# Patient Record
Sex: Female | Born: 1935 | Race: White | Hispanic: No | Smoking: Former smoker
Health system: Southern US, Community
[De-identification: ages and names within clinical notes are randomized; demographics above are authoritative.]

## PROBLEM LIST (undated history)

## (undated) DIAGNOSIS — F32A Depression, unspecified: Secondary | ICD-10-CM

## (undated) DIAGNOSIS — F0390 Unspecified dementia without behavioral disturbance: Secondary | ICD-10-CM

## (undated) DIAGNOSIS — F419 Anxiety disorder, unspecified: Secondary | ICD-10-CM

## (undated) DIAGNOSIS — F329 Major depressive disorder, single episode, unspecified: Secondary | ICD-10-CM

## (undated) DIAGNOSIS — R55 Syncope and collapse: Secondary | ICD-10-CM

## (undated) DIAGNOSIS — I10 Essential (primary) hypertension: Secondary | ICD-10-CM

## (undated) HISTORY — DX: Depression, unspecified: F32.A

## (undated) HISTORY — DX: Anxiety disorder, unspecified: F41.9

## (undated) HISTORY — DX: Syncope and collapse: R55

## (undated) HISTORY — PX: APPENDECTOMY: SHX54

## (undated) HISTORY — DX: Major depressive disorder, single episode, unspecified: F32.9

---

## 2004-05-24 ENCOUNTER — Ambulatory Visit: Payer: Self-pay | Admitting: Unknown Physician Specialty

## 2005-02-24 ENCOUNTER — Ambulatory Visit: Payer: Self-pay | Admitting: Internal Medicine

## 2005-03-01 ENCOUNTER — Ambulatory Visit: Payer: Self-pay | Admitting: Internal Medicine

## 2005-09-01 ENCOUNTER — Ambulatory Visit: Payer: Self-pay | Admitting: Internal Medicine

## 2006-03-08 ENCOUNTER — Ambulatory Visit: Payer: Self-pay | Admitting: Internal Medicine

## 2007-08-14 ENCOUNTER — Ambulatory Visit: Payer: Self-pay | Admitting: Internal Medicine

## 2007-08-21 ENCOUNTER — Ambulatory Visit: Payer: Self-pay | Admitting: Ophthalmology

## 2007-08-21 ENCOUNTER — Other Ambulatory Visit: Payer: Self-pay

## 2007-08-27 ENCOUNTER — Ambulatory Visit: Payer: Self-pay | Admitting: Ophthalmology

## 2008-07-07 ENCOUNTER — Ambulatory Visit: Payer: Self-pay | Admitting: Unknown Physician Specialty

## 2008-09-25 ENCOUNTER — Ambulatory Visit: Payer: Self-pay | Admitting: Internal Medicine

## 2009-10-01 ENCOUNTER — Ambulatory Visit: Payer: Self-pay | Admitting: Internal Medicine

## 2010-10-05 ENCOUNTER — Ambulatory Visit: Payer: Self-pay | Admitting: Internal Medicine

## 2011-03-09 ENCOUNTER — Inpatient Hospital Stay: Payer: Self-pay | Admitting: Internal Medicine

## 2011-05-06 ENCOUNTER — Inpatient Hospital Stay: Payer: Self-pay | Admitting: Internal Medicine

## 2011-05-06 DIAGNOSIS — R55 Syncope and collapse: Secondary | ICD-10-CM

## 2011-05-09 DIAGNOSIS — R55 Syncope and collapse: Secondary | ICD-10-CM

## 2011-05-12 ENCOUNTER — Telehealth: Payer: Self-pay | Admitting: *Deleted

## 2011-05-12 NOTE — Telephone Encounter (Signed)
Spoke to daughter, notified of pt's holter results. She has received event monitor and will place today, pt will f/u 10/9.

## 2011-05-12 NOTE — Telephone Encounter (Signed)
Notified pt of holter results per Dr. Mariah Milling NSR with frequent PVCs, rare APCs. No cause of dizziness noted. Pt has not received her 30 day event monitor yet (they do not send until pt answers phone to confirm delivery). I called [employer], they sent monitor 10/2, pt should receive today and I assured that they will call her with instructions on placement as she has many questions. Pt will f/u with TG next week.

## 2011-05-16 ENCOUNTER — Encounter: Payer: Self-pay | Admitting: Cardiovascular Disease

## 2011-05-17 ENCOUNTER — Encounter: Payer: Self-pay | Admitting: Cardiovascular Disease

## 2011-06-14 ENCOUNTER — Encounter: Payer: Self-pay | Admitting: Cardiovascular Disease

## 2011-06-14 ENCOUNTER — Ambulatory Visit (INDEPENDENT_AMBULATORY_CARE_PROVIDER_SITE_OTHER): Payer: Medicare Other | Admitting: Cardiovascular Disease

## 2011-06-14 VITALS — BP 124/78 | HR 72 | Ht 63.0 in | Wt 128.0 lb

## 2011-06-14 DIAGNOSIS — R55 Syncope and collapse: Secondary | ICD-10-CM

## 2011-06-14 NOTE — Assessment & Plan Note (Signed)
Etiology of numerous episodes of syncope is uncertain at this time. Workup has been negative including stress test, echo, MRI, CT, Holter and 30 day event monitor. She attributes her symptoms to stress from her husband who has dementia.   If her symptoms were in fact syncope which I believe they are, it is likely not secondary to stress and probably will present itself again. I suspect there is underlying dementia and the patient and a sense of denial. One option if she has additional episodes would be an implantable loop monitor which would monitor her heart rhythm was for 3 years. She is not on any medication currently that could contribute to orthostasis. Her last contact us if she has any further lightheadedness or syncope. I'm concerned about her living alone and driving though we do not have any concrete data I would restrict her as her testing has been normal. She has not had a life alert button Which she needs as she does live alone. No family presented with her on today's visit.

## 2011-06-14 NOTE — Patient Instructions (Signed)
You are doing well. No medication changes were made.  Please call us if you have new issues that need to be addressed before your next appt.  The office will contact you for a follow up Appt. In 12 months  
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
## 2011-09-02 ENCOUNTER — Inpatient Hospital Stay: Payer: Self-pay | Admitting: Unknown Physician Specialty

## 2011-09-02 LAB — COMPREHENSIVE METABOLIC PANEL
Alkaline Phosphatase: 46 U/L — ABNORMAL LOW (ref 50–136)
Calcium, Total: 8.8 mg/dL (ref 8.5–10.1)
Co2: 28 mmol/L (ref 21–32)
Osmolality: 277 (ref 275–301)
SGOT(AST): 49 U/L — ABNORMAL HIGH (ref 15–37)
Sodium: 138 mmol/L (ref 136–145)

## 2011-09-02 LAB — CBC
MCH: 32.9 pg (ref 26.0–34.0)
Platelet: 142 10*3/uL — ABNORMAL LOW (ref 150–440)
WBC: 12.9 10*3/uL — ABNORMAL HIGH (ref 3.6–11.0)

## 2011-09-02 LAB — TROPONIN I: Troponin-I: <0.02 ng/mL

## 2011-09-02 LAB — URINALYSIS, COMPLETE
Leukocyte Esterase: NEGATIVE
Protein: NEGATIVE
RBC,UR: NONE SEEN /HPF (ref 0–5)
WBC UR: NONE SEEN /HPF (ref 0–5)

## 2011-09-04 LAB — CBC WITH DIFFERENTIAL/PLATELET
Basophil #: 0 10*3/uL (ref 0.0–0.1)
Basophil %: 0.3 %
Eosinophil #: 0.1 10*3/uL (ref 0.0–0.7)
Eosinophil %: 0.7 %
Lymphocyte #: 1 10*3/uL (ref 1.0–3.6)
MCH: 32.6 pg (ref 26.0–34.0)
MCV: 95 fL (ref 80–100)
Monocyte #: 0.6 10*3/uL (ref 0.0–0.7)
Platelet: 107 10*3/uL — ABNORMAL LOW (ref 150–440)
RBC: 3.21 10*6/uL — ABNORMAL LOW (ref 3.80–5.20)

## 2012-05-08 ENCOUNTER — Ambulatory Visit: Payer: Self-pay | Admitting: Ophthalmology

## 2012-05-28 ENCOUNTER — Ambulatory Visit: Payer: Self-pay | Admitting: Ophthalmology

## 2014-11-25 NOTE — Op Note (Signed)
PATIENT NAME:  Vanessa Jackson, Vanessa Jackson MR#:  [id number] DATE OF BIRTH:  [birth year]  DATE OF PROCEDURE:  05/28/2012  PREOPERATIVE DIAGNOSIS:  Cataract, right eye.   POSTOPERATIVE DIAGNOSIS:  Cataract, right eye.  PROCEDURE PERFORMED:  Extracapsular cataract extraction using phacoemulsification with placement of an Alcon SN6CWS, 22.0-diopter posterior chamber lens, serial # [id number].  SURGEON:  Maylon PeppersSteven A. Wynston Romey, MD  ASSISTANT:  None.  ANESTHESIA:  4% lidocaine and 0.75% Marcaine in a 50/50 mixture with 10 units/mL of Hylenex added, given as a peribulbar.  ANESTHESIOLOGIST:  Dr. Pernell DupreAdams  COMPLICATIONS:  None.  ESTIMATED BLOOD LOSS:  Less than 1 mL.  DESCRIPTION OF PROCEDURE:  The patient was brought to the operating room and given a peribulbar block.  The patient was then prepped and draped in the usual fashion.  The vertical rectus muscles were imbricated using 5-0 silk sutures.  These sutures were then clamped to the sterile drapes as bridle sutures.  A limbal peritomy was performed extending two clock hours and hemostasis was obtained with cautery.  A partial thickness scleral groove was made at the surgical limbus and dissected anteriorly in a lamellar dissection using an Alcon crescent knife.  The anterior chamber was entered superonasally with a Superblade and through the lamellar dissection with a 2.6 mm keratome.  DisCoVisc was used to replace the aqueous and a continuous tear capsulorrhexis was carried out.  Hydrodissection and hydrodelineation were carried out with balanced salt and a 27 gauge canula.  The nucleus was rotated to confirm the effectiveness of the hydrodissection.  Phacoemulsification was carried out using a divide-and-conquer technique.  Total ultrasound time was 4 minutes and 31.8 seconds with an average power of 27.5 percent. CDE 106.8.  Irrigation/aspiration was used to remove the residual cortex.  DisCoVisc was used to inflate the capsule and the internal incision  was enlarged to 3 mm with the crescent knife.  The intraocular lens was folded and inserted into the capsular bag using the AcrySert delivery system.  Irrigation/aspiration was used to remove the residual DisCoVisc.  Miostat was injected into the anterior chamber through the paracentesis track to inflate the anterior chamber and induce miosis.  The wound was checked for leaks and none were found. The conjunctiva was closed with cautery and the bridle sutures were removed.  Two drops of 0.3% Vigamox were placed on the eye.   An eye shield was placed on the eye.  The patient was discharged to the recovery room in good condition.  ____________________________ Maylon PeppersSteven A. Remi Rester, MD sad:cms D: 05/28/2012 13:37:52 ET T: 05/28/2012 13:50:42 ET JOB#: [id number]  cc: Viviann SpareSteven A. Leandro Berkowitz, MD, <Dictator>  Erline LevineSTEVEN A Symphony Demuro MD ELECTRONICALLY SIGNED 06/04/2012 13:54
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
## 2014-11-30 NOTE — Op Note (Signed)
PATIENT NAME:  Vanessa Jackson, [name] MR#:  [id number] DATE OF BIRTH:  [birth year]  DATE OF PROCEDURE:  09/03/2011  PREOPERATIVE DIAGNOSIS: Comminuted patellar fracture, right knee.   POSTOPERATIVE DIAGNOSIS: Comminuted patellar fracture, right knee.   PROCEDURE: Repair of patellar fracture with excision of fragments and repair of patellar tendon to undersurface of the patella.   SURGEON: Winn JockJames C. Fritzie Prioleau, MD    ASSISTANT: None.   ANESTHESIA: Spinal.   ESTIMATED BLOOD LOSS: 100 mL.    REPLACEMENT: 1 liter of Crystalloid.   DRAINS: One Hemovac.   COMPLICATIONS: None.   TOURNIQUET PRESSURE: 300 mmHg.  TOURNIQUET TIME: 47 minutes.   BRIEF CLINICAL NOTE AND PATHOLOGY: The patient fell and suffered the above-mentioned fracture. Date of injury was 09/02/2011. She also suffered a calcaneus fracture. The decision was made to affix the patella and follow the calcaneus for possible fixation at a later date because of swelling. Options, risks, and benefits were discussed in detail, particularly possible excision of fragments. At the time of the procedure, there were multiple fragments and it was felt the patient would best be served by excising the fragments and repairing the patellar tendon directly to the distal pole of the patella. There were some good bony fragments attached which gave bone to bone apposition at the area of repair.   DESCRIPTION OF PROCEDURE: Preop antibiotics, adequate spinal anesthesia, supine position, routine prepping and draping. Appropriate time-out was called.   The leg was elevated and tourniquet was inflated. Routine midline incision was made. The fracture was identified, thoroughly explored. The knee was thoroughly irrigated of blood clot. The fragments were then excised after further examination determined that with the patient's bone quality putting the fragments back together would not result in sufficient repair. Fragments were excised. Drill holes were made in the  patella from distal to proximal. #5 Ethibond suture was then woven and the strands were passed through the patella. They were then tied appropriately. The lateral retinaculum was repaired with #5 nonabsorbable suture. The knee was flexed to 110 degrees without any separation in the area of repair.   The area was thoroughly irrigated as it had been throughout the procedure. The sub-Q was closed with 0 quill, skin closed with staples. Soft sterile dressing was applied. The patient was placed in a hinged knee brace.   The calcaneus fracture was examined with fluoroscopy and there was noted to be no significant prominent bone. A compressive dressing was applied. Polar Care was applied. The patient was taken to the PAC-U having tolerated the procedure well.   ____________________________ Winn JockJames C. Gerrit Heckaliff, MD jcc:drc D: 09/04/2011 14:11:38 ET T: 09/05/2011 08:37:17 ET JOB#: [id number]  cc: Winn JockJames C. Gerrit Heckaliff, MD, <Dictator> Winn JockJAMES C Graiden Henes MD ELECTRONICALLY SIGNED 09/06/2011 8:31
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
## 2014-11-30 NOTE — Consult Note (Signed)
1. pre-op consultno contraindication for surgery at this time, patient mild risk for cardiovascular complication (stress test negative)pre-op b-blockerhx syncope past with large work up all negative.  Spoke with Dr Mariah MillingGollan cardiology- no further work up neededI do no believe this was a syncopal episode, patient is very vague with her answerspersonality change/ ? Dementiapatient fearful of loosing independence, safety at home a questionneuro w/u negative last admitanxiety Development worker, international aidWieting  Electronic Signatures: Alford HighlandWieting, Abigale Dorow (MD)  (Signed on 25-Jan-13 19:49)  Authored  Last Updated: 25-Jan-13 19:49 by Alford HighlandWieting, Nailea Whitehorn (MD)

## 2014-11-30 NOTE — H&P (Signed)
PATIENT NAME:  Vanessa Jackson, Vanessa Jackson MR#:  [id number] DATE OF BIRTH:  02-28-1928  DATE OF ADMISSION:  09/02/2011  ADDENDUM  ORTHOPEDIC EXAMINATION:   GENERAL: The patient is alert and oriented.   SPINE: Cervical and thoracic spine are nontender. Lumbosacral spine reveals mild tenderness.   SHOULDERS, ELBOWS, WRISTS, AND HANDS: Good range of motion, normal neurovascular examination, and no tenderness. There are some slight abrasions over the left hand.   LEFT LOWER EXTREMITY: Hips, knees, feet, and ankles have full range of motion with no pain.   RIGHT LOWER EXTREMITY: The hip has full range of motion with no pain.   Right knee reveals moderate swelling with tenderness. Range of motion attempted is very painful.   Right foot and ankle show moderate swelling with edema. Neurovascular examination of both lower extremities is intact. ____________________________ Winn JockJames Jackson. Gerrit Heckaliff, MD jcc:slb D: 09/04/2011 14:08:25 ET (Entered as incorrect work type - 03) T: 09/05/2011 08:46:26 ET JOB#: [id number]  cc: Winn JockJames Jackson. Gerrit Heckaliff, MD, <Dictator> Winn JockJAMES Jackson Andrian Sabala MD ELECTRONICALLY SIGNED 09/06/2011 8:31

## 2017-11-02 DIAGNOSIS — F0150 Vascular dementia without behavioral disturbance: Secondary | ICD-10-CM | POA: Diagnosis not present

## 2017-11-02 DIAGNOSIS — S72012A Unspecified intracapsular fracture of left femur, initial encounter for closed fracture: Secondary | ICD-10-CM | POA: Diagnosis not present

## 2017-11-02 DIAGNOSIS — F39 Unspecified mood [affective] disorder: Secondary | ICD-10-CM

## 2017-11-02 DIAGNOSIS — I10 Essential (primary) hypertension: Secondary | ICD-10-CM

## 2017-11-09 DIAGNOSIS — S72012A Unspecified intracapsular fracture of left femur, initial encounter for closed fracture: Secondary | ICD-10-CM | POA: Diagnosis not present

## 2017-11-09 DIAGNOSIS — F39 Unspecified mood [affective] disorder: Secondary | ICD-10-CM

## 2017-11-09 DIAGNOSIS — M810 Age-related osteoporosis without current pathological fracture: Secondary | ICD-10-CM

## 2017-11-09 DIAGNOSIS — F0150 Vascular dementia without behavioral disturbance: Secondary | ICD-10-CM | POA: Diagnosis not present

## 2017-11-09 DIAGNOSIS — I10 Essential (primary) hypertension: Secondary | ICD-10-CM | POA: Diagnosis not present

## 2017-12-31 ENCOUNTER — Other Ambulatory Visit: Payer: Self-pay

## 2017-12-31 ENCOUNTER — Encounter: Payer: Self-pay | Admitting: Emergency Medicine

## 2017-12-31 ENCOUNTER — Emergency Department: Payer: Medicare Other

## 2017-12-31 ENCOUNTER — Emergency Department
Admission: EM | Admit: 2017-12-31 | Discharge: 2018-01-01 | Disposition: A | Discharge status: Home / Self Care | Payer: Medicare Other | Attending: Emergency Medicine | Admitting: Emergency Medicine

## 2017-12-31 DIAGNOSIS — Z79899 Other long term (current) drug therapy: Secondary | ICD-10-CM | POA: Insufficient documentation

## 2017-12-31 DIAGNOSIS — R55 Syncope and collapse: Secondary | ICD-10-CM | POA: Insufficient documentation

## 2017-12-31 DIAGNOSIS — F0150 Vascular dementia without behavioral disturbance: Secondary | ICD-10-CM | POA: Diagnosis not present

## 2017-12-31 DIAGNOSIS — E860 Dehydration: Secondary | ICD-10-CM | POA: Insufficient documentation

## 2017-12-31 DIAGNOSIS — R42 Dizziness and giddiness: Secondary | ICD-10-CM | POA: Insufficient documentation

## 2017-12-31 DIAGNOSIS — Z87891 Personal history of nicotine dependence: Secondary | ICD-10-CM | POA: Diagnosis not present

## 2017-12-31 LAB — BASIC METABOLIC PANEL
Anion gap: 9 (ref 5–15)
BUN: 25 mg/dL — AB (ref 6–20)
CO2: 25 mmol/L (ref 22–32)
CREATININE: 1.25 mg/dL — AB (ref 0.44–1.00)
Calcium: 8.5 mg/dL — ABNORMAL LOW (ref 8.9–10.3)
Chloride: 99 mmol/L — ABNORMAL LOW (ref 101–111)
GFR, EST AFRICAN AMERICAN: 44 mL/min — AB (ref >60)
GFR, EST NON AFRICAN AMERICAN: 38 mL/min — AB (ref >60)
Glucose, Bld: 137 mg/dL — ABNORMAL HIGH (ref 65–99)
Potassium: 4.7 mmol/L (ref 3.5–5.1)
SODIUM: 133 mmol/L — AB (ref 135–145)

## 2017-12-31 LAB — HEPATIC FUNCTION PANEL
ALT: 17 U/L (ref 14–54)
AST: 18 U/L (ref 15–41)
Albumin: 3.3 g/dL — ABNORMAL LOW (ref 3.5–5.0)
Alkaline Phosphatase: 113 U/L (ref 38–126)
BILIRUBIN DIRECT: 0.2 mg/dL (ref 0.1–0.5)
BILIRUBIN INDIRECT: 0.6 mg/dL (ref 0.3–0.9)
BILIRUBIN TOTAL: 0.8 mg/dL (ref 0.3–1.2)
TOTAL PROTEIN: 6.3 g/dL — AB (ref 6.5–8.1)

## 2017-12-31 LAB — CBC
HCT: 37.7 % (ref 35.0–47.0)
Hemoglobin: 12.8 g/dL (ref 12.0–16.0)
MCH: 31.8 pg (ref 26.0–34.0)
MCHC: 33.9 g/dL (ref 32.0–36.0)
MCV: 93.8 fL (ref 80.0–100.0)
PLATELETS: 180 10*3/uL (ref 150–440)
RBC: 4.02 MIL/uL (ref 3.80–5.20)
RDW: 13 % (ref 11.5–14.5)
WBC: 9.1 10*3/uL (ref 3.6–11.0)

## 2017-12-31 LAB — TROPONIN I

## 2017-12-31 LAB — LACTIC ACID, PLASMA: LACTIC ACID, VENOUS: 1.3 mmol/L (ref 0.5–1.9)
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
## 2018-01-01 DIAGNOSIS — R55 Syncope and collapse: Secondary | ICD-10-CM | POA: Diagnosis not present

## 2018-01-01 LAB — URINALYSIS, COMPLETE (UACMP) WITH MICROSCOPIC
BILIRUBIN URINE: NEGATIVE
Glucose, UA: NEGATIVE mg/dL
Hgb urine dipstick: NEGATIVE
Ketones, ur: NEGATIVE mg/dL
LEUKOCYTES UA: NEGATIVE
Nitrite: NEGATIVE
Protein, ur: NEGATIVE mg/dL
SQUAMOUS EPITHELIAL / LPF: NONE SEEN (ref 0–5)
Specific Gravity, Urine: 1.01 (ref 1.005–1.030)
pH: 5 (ref 5.0–8.0)

## 2018-01-01 MED ORDER — SODIUM CHLORIDE 0.9 % IV BOLUS: 1000.0000 mL | Freq: Once | INTRAVENOUS | Status: DC

## 2018-01-01 MED ORDER — SODIUM CHLORIDE 0.9 % IV BOLUS
1000.0000 mL | Freq: Once | INTRAVENOUS | Status: AC
Start: 2018-01-01 — End: 2018-01-01
  Administered 2018-01-01: 1000 mL via INTRAVENOUS

## 2018-01-01 NOTE — ED Provider Notes (Signed)
[facility] Emergency Department Provider Note   ____________________________________________   First MD Initiated Contact with Patient 12/31/17 2309     (approximate)  I have reviewed the triage vital signs and the nursing notes.   HISTORY  Chief Complaint Weakness and Near Syncope  Patient with a history of vascular dementia has a hard time contributing to history.  HPI Vanessa Jackson is a 84 y.o. female with a history of vascular dementia who comes into the hospital today with a near syncopal episode.  The patient has been weak and has not been eating well according to EMS.  She recently finished an antibiotic for urinary tract infection and tonight had a couple of near syncopal episodes when her daughter was trying to get her up to take her to bed.  EMS states that the patient's blood pressure was in the 86/58 and her oxygen saturation was 88%.  She received 500 mL of normal saline by EMS.  She denies any chest pain, abdominal pain, vomiting.  The patient states that she has had some loose stools but she states that it has gotten better.  The patient denies a cough and states that she is been trying to take care of herself and eat healthy.  She reports that she felt lightheaded about 2-3 times a day.  The patient's daughter states that she has had over thousand many strokes.  The daughter states that she has not been eating or drinking much especially given her history of dementia.  She states also that the patient has had continued diarrhea and although she did have multiple bottles of water today they have been trying to encourage her for days to eat and drink more.  The patient has been dizzy much of the day.   Past Medical History:  Diagnosis Date  . Anxiety   . Anxiety   . Depression   . Syncope     Patient Active Problem List   Diagnosis Date Noted  . Syncope 06/14/2011    Past Surgical History:  Procedure Laterality Date  . APPENDECTOMY       Prior to Admission medications   Medication Sig Start Date End Date Taking? Authorizing Provider  mirtazapine (REMERON) 15 MG tablet Take 15 mg by mouth at bedtime.      [provider]    Allergies Codeine; Hydrocodone; Quetiapine; and Shellfish allergy  History reviewed. No pertinent family history.  Social History Social History   Tobacco Use  . Smoking status: Former Smoker    Packs/day: 1.00    Years: 2.00    Pack years: 2.00    Types: Cigarettes  . Smokeless tobacco: Never Used  Substance Use Topics  . Alcohol use: Yes    Alcohol/week: 0.6 oz    Types: 1 Glasses of wine per week  . Drug use: No    Review of Systems  Constitutional: No fever/chills Eyes: No visual changes. ENT: No sore throat. Cardiovascular: Denies chest pain. Respiratory: Denies shortness of breath. Gastrointestinal: Diarrhea with no abdominal pain.  No nausea, no vomiting.   Genitourinary: Negative for dysuria. Musculoskeletal: Negative for back pain. Skin: Negative for rash. Neurological: Dizziness   ____________________________________________   PHYSICAL EXAM:  VITAL SIGNS: ED Triage Vitals  Enc Vitals Group     BP 12/31/17 2308 93/65     Pulse Rate 12/31/17 2308 89     Resp 12/31/17 2308 16     Temp --      Temp Source 12/31/17  2308 Oral     SpO2 12/31/17 2308 90 %     Weight 12/31/17 2309 125 lb (56.7 kg)     Height 12/31/17 2309  (1.6 m)     Head Circumference --      Peak Flow --      Pain Score 12/31/17 2309 0     Pain Loc --      Pain Edu? --      Excl. in GC? --     Constitutional: Alert and oriented. Well appearing and in no acute distress. Eyes: Conjunctivae are normal. PERRL. EOMI. Head: Atraumatic. Nose: No congestion/rhinnorhea. Mouth/Throat: Mucous membranes are moist.  Oropharynx non-erythematous. Cardiovascular: Normal rate, regular rhythm. Grossly normal heart sounds.  Good peripheral circulation. Respiratory: Normal respiratory  effort.  No retractions. Lungs CTAB. Gastrointestinal: Soft and nontender. No distention.  Positive bowel sounds Musculoskeletal: No lower extremity tenderness nor edema.   Neurologic:  Normal speech and language.  Left-sided facial droop otherwise cranial nerves intact, upper and lower extremity strength intact, sensation intact Skin:  Skin is warm, dry and intact.  Psychiatric: Mood and affect are normal.   ____________________________________________   LABS (all labs ordered are listed, but only abnormal results are displayed)  Labs Reviewed  BASIC METABOLIC PANEL - Abnormal; Notable for the following components:      Result Value   Sodium 133 (*)    Chloride 99 (*)    Glucose, Bld 137 (*)    BUN 25 (*)    Creatinine, Ser 1.25 (*)    Calcium 8.5 (*)    GFR calc non Af Amer 38 (*)    GFR calc Af Amer 44 (*)    All other components within normal limits  URINALYSIS, COMPLETE (UACMP) WITH MICROSCOPIC - Abnormal; Notable for the following components:   Color, Urine YELLOW (*)    APPearance CLEAR (*)    Bacteria, UA RARE (*)    All other components within normal limits  HEPATIC FUNCTION PANEL - Abnormal; Notable for the following components:   Total Protein 6.3 (*)    Albumin 3.3 (*)    All other components within normal limits  CBC  LACTIC ACID, PLASMA  TROPONIN I   ____________________________________________  EKG  ED ECG REPORT I, Rebecka Apley, the attending physician, personally viewed and interpreted this ECG.   Date: 12/31/2017  EKG Time: 2302  Rate: 90  Rhythm: normal sinus rhythm  Axis: normal  Intervals:none  ST&T Change: none  ____________________________________________  RADIOLOGY  ED MD interpretation:  CXR: Cardiac enlargement, emphysematous and chronic bronchitic changes in the lungs, no evidence of acute pulmonary disease, old fracture deformities in the spine and shoulders likely osteoporosis.  CT head: no acute intracranial abnormalities,  chronic atrophy and small vessel ischemic changes.  Official radiology report(s): Dg Chest 2 View  Result Date: 01/01/2018 CLINICAL DATA:  Weakness and near syncopal episode this evening. Recently finished antibiotics for urinary tract infection. Hypoxia. EXAM: CHEST - 2 VIEW COMPARISON:  09/02/2011 FINDINGS: Cardiac enlargement. No vascular congestion. No edema or consolidation. Emphysematous changes and scattered fibrosis in the lungs. No blunting of costophrenic angles. No pneumothorax. Calcified and tortuous aorta. Degenerative changes in the spine and shoulders. Old fracture deformities of bilateral proximal humerus. Compression deformities of lower thoracic/upper lumbar vertebrae. IMPRESSION: Cardiac enlargement. Emphysematous and chronic bronchitic changes in the lungs. No evidence of active pulmonary disease. Old fracture deformities in the spine and shoulders. Likely osteoporosis. Electronically Signed   By: Chrissie Noa  Andria Meuse M.D.   On: 01/01/2018 00:07   Ct Head Wo Contrast  Result Date: 01/01/2018 CLINICAL DATA:  Weakness and near syncope this evening. Recently finished antibiotics for urinary tract infection. History of dementia. EXAM: CT HEAD WITHOUT CONTRAST TECHNIQUE: Contiguous axial images were obtained from the base of the skull through the vertex without intravenous contrast. COMPARISON:  09/02/2011 FINDINGS: Brain: Diffuse cerebral atrophy. Ventricular dilatation consistent with central atrophy. Low-attenuation changes in the deep white matter consistent with small vessel ischemia. No evidence of acute infarction, hemorrhage, hydrocephalus, extra-axial collection or mass lesion/mass effect. Vascular: Intracranial arterial vascular calcifications are present. Skull: The calvarium appears intact. Sinuses/Orbits: Paranasal sinuses and mastoid air cells are clear. Other: None. IMPRESSION: No acute intracranial abnormalities. Chronic atrophy and small vessel ischemic changes. Electronically  Signed   By: Burman Nieves M.D.   On: 01/01/2018 00:11    ____________________________________________   PROCEDURES  Procedure(s) performed: None  Procedures  Critical Care performed: No  ____________________________________________   INITIAL IMPRESSION / ASSESSMENT AND PLAN / ED COURSE  As part of my medical decision making, I reviewed the following data within the electronic MEDICAL RECORD NUMBER Notes from prior ED visits and [place] Controlled Substance Database   I was about to this is an 84 year old female who comes into the hospital today with a presyncopal episode.  The patient's blood pressure was in the 80s systolic when EMS arrived.  By the time I evaluated the patient her blood pressure was in the 90s but she received a 500 mL bag of normal saline.  I gave the patient a full liter bag and check some blood work.  The patient CBC and urinalysis were both unremarkable.  Her creatinine was 1.65 which appeared to be up from 6 years ago.  The patient had a chest x-ray and a CT of her head which were both unremarkable.  After the fluids the patient's blood pressure was improved.  She was able to get up and walk to the bathroom without any difficulty.  The patient's daughter did complain that she was concerned about the patient's abilities at home.  She states that the patient has not received her appropriate rehab after she was discharged with her hip fracture.  The patient is also unable to be placed in a facility according to the patient's daughter due to the cost.  The patient has been able to ambulate in the ER with no more dizziness.      ____________________________________________   FINAL CLINICAL IMPRESSION(S) / ED DIAGNOSES  Final diagnoses:  Near syncope  Dehydration  Dizziness     ED Discharge Orders    None       Note:  This document was prepared using Dragon voice recognition software and may include unintentional dictation errors.    Rebecka Apley,  MD 01/01/18 [phone]

## 2018-01-01 NOTE — ED Notes (Signed)
Pt assisted up to toilet. Pt required frequent direction with the walker and to use the toilet but tolerated well. No dizziness or other co's voiced.

## 2018-01-01 NOTE — Discharge Instructions (Addendum)
Please follow up with your primary care physician.

## 2018-01-01 NOTE — ED Notes (Signed)
Pt up and ambulated with a walker around the nursing station by EDT. Tolerated well. Dr Zenda Alpers informed.

## 2018-05-13 ENCOUNTER — Emergency Department: Payer: Medicare Other

## 2018-05-13 ENCOUNTER — Emergency Department
Admission: EM | Admit: 2018-05-13 | Discharge: 2018-05-13 | Disposition: A | Discharge status: Skilled Nursing Facility | Payer: Medicare Other | Attending: Emergency Medicine | Admitting: Emergency Medicine

## 2018-05-13 DIAGNOSIS — Z87891 Personal history of nicotine dependence: Secondary | ICD-10-CM | POA: Insufficient documentation

## 2018-05-13 DIAGNOSIS — F0390 Unspecified dementia without behavioral disturbance: Secondary | ICD-10-CM | POA: Insufficient documentation

## 2018-05-13 DIAGNOSIS — R451 Restlessness and agitation: Secondary | ICD-10-CM | POA: Diagnosis not present

## 2018-05-13 DIAGNOSIS — Z7982 Long term (current) use of aspirin: Secondary | ICD-10-CM | POA: Insufficient documentation

## 2018-05-13 DIAGNOSIS — R410 Disorientation, unspecified: Secondary | ICD-10-CM

## 2018-05-13 DIAGNOSIS — E860 Dehydration: Secondary | ICD-10-CM | POA: Diagnosis not present

## 2018-05-13 DIAGNOSIS — I10 Essential (primary) hypertension: Secondary | ICD-10-CM | POA: Diagnosis not present

## 2018-05-13 DIAGNOSIS — R4182 Altered mental status, unspecified: Secondary | ICD-10-CM | POA: Diagnosis present

## 2018-05-13 DIAGNOSIS — Z79899 Other long term (current) drug therapy: Secondary | ICD-10-CM | POA: Diagnosis not present

## 2018-05-13 HISTORY — DX: Essential (primary) hypertension: I10

## 2018-05-13 HISTORY — DX: Unspecified dementia, unspecified severity, without behavioral disturbance, psychotic disturbance, mood disturbance, and anxiety: F03.90

## 2018-05-13 LAB — CBC
HCT: 40.2 % (ref 35.0–47.0)
HEMOGLOBIN: 13.4 g/dL (ref 12.0–16.0)
MCH: 32.3 pg (ref 26.0–34.0)
MCHC: 33.3 g/dL (ref 32.0–36.0)
MCV: 96.9 fL (ref 80.0–100.0)
PLATELETS: 192 10*3/uL (ref 150–440)
RBC: 4.15 MIL/uL (ref 3.80–5.20)
RDW: 13.2 % (ref 11.5–14.5)
WBC: 7.3 10*3/uL (ref 3.6–11.0)

## 2018-05-13 LAB — URINALYSIS, COMPLETE (UACMP) WITH MICROSCOPIC
BILIRUBIN URINE: NEGATIVE
Bacteria, UA: NONE SEEN
GLUCOSE, UA: NEGATIVE mg/dL
HGB URINE DIPSTICK: NEGATIVE
KETONES UR: NEGATIVE mg/dL
LEUKOCYTES UA: NEGATIVE
Nitrite: NEGATIVE
Protein, ur: NEGATIVE mg/dL
Specific Gravity, Urine: 1.005 (ref 1.005–1.030)
Squamous Epithelial / LPF: NONE SEEN (ref 0–5)
pH: 7 (ref 5.0–8.0)

## 2018-05-13 LAB — COMPREHENSIVE METABOLIC PANEL
ALBUMIN: 4.1 g/dL (ref 3.5–5.0)
ALT: 12 U/L (ref 0–44)
ANION GAP: 9 (ref 5–15)
AST: 21 U/L (ref 15–41)
Alkaline Phosphatase: 56 U/L (ref 38–126)
BUN: 10 mg/dL (ref 8–23)
CALCIUM: 9.1 mg/dL (ref 8.9–10.3)
CHLORIDE: 94 mmol/L — AB (ref 98–111)
CO2: 28 mmol/L (ref 22–32)
Creatinine, Ser: 0.72 mg/dL (ref 0.44–1.00)
GFR calc non Af Amer: >60 mL/min (ref >60)
GLUCOSE: 162 mg/dL — AB (ref 70–99)
POTASSIUM: 4.3 mmol/L (ref 3.5–5.1)
SODIUM: 131 mmol/L — AB (ref 135–145)
Total Bilirubin: 1.1 mg/dL (ref 0.3–1.2)
Total Protein: 7 g/dL (ref 6.5–8.1)

## 2018-05-13 MED ORDER — POLYETHYLENE GLYCOL 3350 17 GM/SCOOP PO POWD: 17.0000 g | ORAL | 0 refills | Status: DC

## 2018-05-13 MED ORDER — SODIUM CHLORIDE 0.9 % IV BOLUS: 1000.0000 mL | Freq: Once | INTRAVENOUS | Status: DC

## 2018-05-13 NOTE — ED Triage Notes (Signed)
Pt presents via POV from [facility] from which pt resides. Pt daughter reports pt has had altered mental status x1 week. Daughter reports pt not eating and has been aggressive. Daughtter reports similar symptoms with previous UTI. Hx of reoccurring UTIs per daughter. Pt denies pain. Hx dementia.

## 2018-05-13 NOTE — ED Notes (Signed)
Pt given water at this time 

## 2018-05-13 NOTE — Discharge Instructions (Signed)
Your blood and urine test today were normal.  A CT scan of the head was also okay.  Your symptoms are likely exacerbated by dehydration.  We gave you IV fluids to help with this.  Please follow-up with your doctor tomorrow.  Your residential facility should offer fluids to drink at least every 4 hours and encouraged toilet use at least every 4 hours to maintain good hydration and hygiene and prevent recurrent urinary tract infections.

## 2018-05-13 NOTE — ED Notes (Signed)
Report called to Carilyn Goodpasture, [place] at [facility]. Pt discharged with daughter. Daughter forgot to sign e sig prior to discharge.

## 2018-05-13 NOTE — ED Notes (Signed)
Pt ambulated to toilet with assistance. Pt was able to have bowel movement and urinate without pain.

## 2018-05-13 NOTE — ED Provider Notes (Signed)
[facility] Emergency Department Provider Note  ____________________________________________  Time seen: Approximately 5:09 PM  I have reviewed the triage vital signs and the nursing notes.   HISTORY  Chief Complaint Altered Mental Status  Level 5 Caveat: Portions of the History and Physical including HPI and review of systems are unable to be completely obtained due to patient being a poor historian due to dementia   HPI Vanessa Jackson is a 84 y.o. female with a history of anxiety dementia depression hypertension and recurrent fecal impactions is brought to the ED due to agitation, gradual onset over the past week, worsening.  Over the last several days she is also eating less and not drinking fluids.  Daughter at bedside reports this is common whenever the patient gets a urinary tract infection or a fecal impaction.  Impactions have been corrected with digital disimpaction in the past.  Patient takes an antihypertensive and Metamucil daily.  Patient denies any acute complaints.      Past Medical History:  Diagnosis Date  . Anxiety   . Anxiety   . Dementia (HCC)   . Depression   . Hypertension   . Syncope      Patient Active Problem List   Diagnosis Date Noted  . Syncope 06/14/2011     Past Surgical History:  Procedure Laterality Date  . APPENDECTOMY       Prior to Admission medications   Medication Sig Start Date End Date Taking? Authorizing Provider  acetaminophen (TYLENOL) 325 MG tablet Take 650 mg by mouth every 8 (eight) hours as needed.   Yes [provider]  aspirin EC 81 MG tablet Take 81 mg by mouth daily.   Yes [provider]  atorvastatin (LIPITOR) 20 MG tablet Take 20 mg by mouth at bedtime.   Yes [provider]  Calcium Carbonate-Vitamin D3 (CALCIUM 600-D) 600-400 MG-UNIT TABS Take 1 tablet by mouth daily.   Yes [provider]  citalopram (CELEXA) 20 MG tablet Take 30 mg by mouth daily.  (1&1/2 Tablet)   Yes [provider]  Cranberry 500 MG CAPS Take 1 capsule by mouth daily.   Yes [provider]  Melatonin 3 MG TABS Take 2 tablets by mouth at bedtime.   Yes [provider]  psyllium (HYDROCIL/METAMUCIL) 95 % PACK Take 1 packet by mouth 2 (two) times daily.   Yes [provider]  mirtazapine (REMERON) 15 MG tablet Take 15 mg by mouth at bedtime.      [provider]  polyethylene glycol powder (GLYCOLAX/MIRALAX) powder Take 17 g by mouth every other day. 1 cap full in a full glass of water, every other day, until discontinued by your doctor. 05/13/18   Sharman Cheek, MD     Allergies Codeine; Hydrocodone; Quetiapine; and Shellfish allergy   History reviewed. No pertinent family history.  Social History Social History   Tobacco Use  . Smoking status: Former Smoker    Packs/day: 1.00    Years: 2.00    Pack years: 2.00    Types: Cigarettes  . Smokeless tobacco: Never Used  Substance Use Topics  . Alcohol use: Yes    Alcohol/week: 1.0 standard drinks    Types: 1 Glasses of wine per week  . Drug use: No    Review of Systems  Constitutional:   No fever or chills.  ENT:   No sore throat. No rhinorrhea. Cardiovascular:   No chest pain or syncope. Respiratory:   No dyspnea or  cough. Gastrointestinal:   Negative for abdominal pain, vomiting and diarrhea.  Normal bowel movement today. Musculoskeletal:   Negative for focal pain or swelling All other systems reviewed and are negative except as documented above in ROS and HPI.  ____________________________________________   PHYSICAL EXAM:  VITAL SIGNS: ED Triage Vitals  Enc Vitals Group     BP 05/13/18 1350 (!) 145/74     Pulse Rate 05/13/18 1350 73     Resp 05/13/18 1350 14     Temp 05/13/18 1350 98 F (36.7 C)     Temp Source 05/13/18 1350 Oral     SpO2 05/13/18 1350 95 %     Weight 05/13/18 1351 111 lb (50.3 kg)     Height --      Head Circumference --       Peak Flow --      Pain Score --      Pain Loc --      Pain Edu? --      Excl. in GC? --     Vital signs reviewed, nursing assessments reviewed.   Constitutional:   Alert and oriented to person. Non-toxic appearance. Eyes:   Conjunctivae are normal. EOMI. PERRL. ENT      Head:   Normocephalic and atraumatic.      Nose:   No congestion/rhinnorhea.       Mouth/Throat:   Dry mucous membranes, no pharyngeal erythema. No peritonsillar mass.       Neck:   No meningismus. Full ROM. Hematological/Lymphatic/Immunilogical:   No cervical lymphadenopathy. Cardiovascular:   RRR. Symmetric bilateral radial and DP pulses.  No murmurs. Cap refill less than 2 seconds. Respiratory:   Normal respiratory effort without tachypnea/retractions. Breath sounds are clear and equal bilaterally. No wheezes/rales/rhonchi. Gastrointestinal:   Soft and nontender. Non distended. There is no CVA tenderness.  No rebound, rigidity, or guarding.  Rectal exam shows empty vault, brown stool.  No hemorrhoids, no impaction Musculoskeletal:   Normal range of motion in all extremities. No joint effusions.  No lower extremity tenderness.  No edema. Neurologic:   Normal speech, often inappropriate language.  Motor grossly intact. No acute focal neurologic deficits are appreciated.  Skin:    Skin is warm, dry and intact. No rash noted.  No petechiae, purpura, or bullae.  ____________________________________________    LABS (pertinent positives/negatives) (all labs ordered are listed, but only abnormal results are displayed) Labs Reviewed  COMPREHENSIVE METABOLIC PANEL - Abnormal; Notable for the following components:      Result Value   Sodium 131 (*)    Chloride 94 (*)    Glucose, Bld 162 (*)    All other components within normal limits  URINALYSIS, COMPLETE (UACMP) WITH MICROSCOPIC - Abnormal; Notable for the following components:   Color, Urine YELLOW (*)    APPearance CLEAR (*)    All other components within  normal limits  URINE CULTURE  CBC   ____________________________________________   EKG    ____________________________________________    RADIOLOGY  Dg Abdomen 1 View  Result Date: 05/13/2018 CLINICAL DATA:  History of bowel obstruction and urinary tract infection. EXAM: ABDOMEN - 1 VIEW COMPARISON:  None. FINDINGS: The bowel gas pattern is normal. No unexpected abdominal calcifications are identified with aortic atherosclerosis noted. No acute or focal bony abnormality. The patient is status post fixation of a left hip fracture. IMPRESSION: No acute abnormality.  Negative for bowel obstruction. Electronically Signed   By: Drusilla Kanner M.D.   On: 05/13/2018 16:14  Ct Head Wo Contrast  Result Date: 05/13/2018 CLINICAL DATA:  Dementia and worsening confusion. EXAM: CT HEAD WITHOUT CONTRAST TECHNIQUE: Contiguous axial images were obtained from the base of the skull through the vertex without intravenous contrast. COMPARISON:  12/31/2017 FINDINGS: Brain: There is no evidence for acute hemorrhage, hydrocephalus, mass lesion, or abnormal extra-axial fluid collection. No definite CT evidence for acute infarction. Diffuse loss of parenchymal volume is consistent with atrophy. Patchy low attenuation in the deep hemispheric and periventricular white matter is nonspecific, but likely reflects chronic microvascular ischemic demyelination. Vascular: No hyperdense vessel or unexpected calcification. Skull: No evidence for fracture. No worrisome lytic or sclerotic lesion. Sinuses/Orbits: The visualized paranasal sinuses and mastoid air cells are clear. Visualized portions of the globes and intraorbital fat are unremarkable. Other: None. IMPRESSION: 1. Stable.  No acute intracranial abnormality. 2. Atrophy with chronic small vessel white matter ischemic disease. Electronically Signed   By: Kennith Center M.D.   On: 05/13/2018 18:14     ____________________________________________   PROCEDURES Procedures  ____________________________________________  DIFFERENTIAL DIAGNOSIS   Dehydration, urinary tract infection, subdural hematoma, constipation, fecal impaction  CLINICAL IMPRESSION / ASSESSMENT AND PLAN / ED COURSE  Pertinent labs & imaging results that were available during my care of the patient were reviewed by me and considered in my medical decision making (see chart for details).    Patient presents with worsening confusion over the past week in the setting of chronic dementia.  Exam otherwise nonfocal.  Vital signs unremarkable.  No signs of stroke.  Clinical Course as of May 13 1916  Wynelle Link May 13, 2018  1705 X-ray unremarkable.  Does show a significant amount of stool on my viewing of the image.  Labs including urinalysis are normal.   [PS]  1718 DRE negative for stool. No impaction. Will get CT head for AMS   [PS]    Clinical Course User Index [PS] Sharman Cheek, MD     ----------------------------------------- 7:12 PM on 05/13/2018 -----------------------------------------  CT negative.  Patient tolerating oral intake, vitals remained stable.  Overall not in distress and nontoxic.  Possibly this is decompensation of her chronic dementia versus aggravation due to dehydration.  I have given instructions to her facility to encourage frequent assistance with toileting and offering fluids to maintain hydration.  Add MiraLAX every other day to avoid constipation issues, follow-up with PCP tomorrow.  ____________________________________________   FINAL CLINICAL IMPRESSION(S) / ED DIAGNOSES    Final diagnoses:  Confusion  Dehydration     ED Discharge Orders         Ordered    Bathroom privileges with assistance     05/13/18 1910    Encourage fluids     05/13/18 1910    polyethylene glycol powder (GLYCOLAX/MIRALAX) powder  Every other day     05/13/18 1910          Portions of  this note were generated with dragon dictation software. Dictation errors may occur despite best attempts at proofreading.    Sharman Cheek, MD 05/13/18 [phone]

## 2018-05-13 NOTE — ED Notes (Signed)
Per pts daughter pt has been more confused than normal for about a week and became aggressive today and more confused at nursing home. Pt able to follow commands and is non aggressive at this time. Per daughter pt is presenting as she did a few ago when she was diagnosed with a bowel obstruction and UTI.

## 2018-05-14 LAB — URINE CULTURE: CULTURE: NO GROWTH

## 2018-07-03 ENCOUNTER — Emergency Department: Payer: Medicare Other

## 2018-07-03 ENCOUNTER — Emergency Department
Admission: EM | Admit: 2018-07-03 | Discharge: 2018-07-03 | Disposition: A | Discharge status: Home / Self Care | Payer: Medicare Other | Attending: Emergency Medicine | Admitting: Emergency Medicine

## 2018-07-03 ENCOUNTER — Other Ambulatory Visit: Payer: Self-pay

## 2018-07-03 DIAGNOSIS — Z7982 Long term (current) use of aspirin: Secondary | ICD-10-CM | POA: Diagnosis not present

## 2018-07-03 DIAGNOSIS — M549 Dorsalgia, unspecified: Secondary | ICD-10-CM | POA: Diagnosis not present

## 2018-07-03 DIAGNOSIS — Y929 Unspecified place or not applicable: Secondary | ICD-10-CM | POA: Insufficient documentation

## 2018-07-03 DIAGNOSIS — Y998 Other external cause status: Secondary | ICD-10-CM | POA: Insufficient documentation

## 2018-07-03 DIAGNOSIS — Y9389 Activity, other specified: Secondary | ICD-10-CM | POA: Insufficient documentation

## 2018-07-03 DIAGNOSIS — Z79899 Other long term (current) drug therapy: Secondary | ICD-10-CM | POA: Insufficient documentation

## 2018-07-03 DIAGNOSIS — W07XXXA Fall from chair, initial encounter: Secondary | ICD-10-CM | POA: Diagnosis not present

## 2018-07-03 DIAGNOSIS — F0390 Unspecified dementia without behavioral disturbance: Secondary | ICD-10-CM | POA: Insufficient documentation

## 2018-07-03 DIAGNOSIS — Z87891 Personal history of nicotine dependence: Secondary | ICD-10-CM | POA: Diagnosis not present

## 2018-07-03 DIAGNOSIS — I10 Essential (primary) hypertension: Secondary | ICD-10-CM | POA: Diagnosis not present

## 2018-07-03 DIAGNOSIS — R51 Headache: Secondary | ICD-10-CM | POA: Diagnosis present

## 2018-07-03 DIAGNOSIS — W19XXXA Unspecified fall, initial encounter: Secondary | ICD-10-CM

## 2018-07-03 LAB — URINALYSIS, ROUTINE W REFLEX MICROSCOPIC
BACTERIA UA: NONE SEEN
BILIRUBIN URINE: NEGATIVE
Glucose, UA: NEGATIVE mg/dL
Hgb urine dipstick: NEGATIVE
KETONES UR: NEGATIVE mg/dL
NITRITE: NEGATIVE
Protein, ur: 30 mg/dL — AB
SPECIFIC GRAVITY, URINE: 1.013 (ref 1.005–1.030)
SQUAMOUS EPITHELIAL / LPF: NONE SEEN (ref 0–5)
pH: 7 (ref 5.0–8.0)

## 2018-07-03 MED ORDER — ACETAMINOPHEN 500 MG PO TABS
1000.0000 mg | ORAL_TABLET | Freq: Once | ORAL | Status: AC
  Administered 2018-07-03: 1000 mg via ORAL
  Filled 2018-07-03: qty 2

## 2018-07-03 NOTE — ED Notes (Signed)
Pt taken to bathroom for urine specimen. Pt is confused and unsteady on her feet. Pt is able to produce sample.

## 2018-07-03 NOTE — ED Triage Notes (Signed)
Pt is from [facility], went to say on chair and missed it. She sat on floor and fell back onto wall hitting back of her head and mid back. Pt with hematoma noted to head, no neck pain.

## 2018-07-03 NOTE — ED Notes (Signed)
Patient transported to X-ray 

## 2018-07-03 NOTE — ED Provider Notes (Signed)
[facility] Emergency Department Provider Note  Time seen: 9:24 PM  I have reviewed the triage vital signs and the nursing notes.   HISTORY  Chief Complaint Fall    HPI Vanessa Jackson is a 84 y.o. female with a past medical history of anxiety, dementia, hypertension, presents to the emergency department after a fall.  According to the patient she had a witnessed fall when she was attempting to sit on a chair and slid backwards off the chair hitting her back and head on the wall.  Denies LOC.  Patient is complaining of pain to the back of the head, as well as her mid back.  No bleeding.  No anticoagulation.   Past Medical History:  Diagnosis Date  . Anxiety   . Anxiety   . Dementia (HCC)   . Depression   . Hypertension   . Syncope     Patient Active Problem List   Diagnosis Date Noted  . Syncope 06/14/2011    Past Surgical History:  Procedure Laterality Date  . APPENDECTOMY      Prior to Admission medications   Medication Sig Start Date End Date Taking? Authorizing Provider  acetaminophen (TYLENOL) 325 MG tablet Take 650 mg by mouth every 8 (eight) hours as needed.    [provider]  aspirin EC 81 MG tablet Take 81 mg by mouth daily.    [provider]  atorvastatin (LIPITOR) 20 MG tablet Take 20 mg by mouth at bedtime.    [provider]  Calcium Carbonate-Vitamin D3 (CALCIUM 600-D) 600-400 MG-UNIT TABS Take 1 tablet by mouth daily.    [provider]  citalopram (CELEXA) 20 MG tablet Take 30 mg by mouth daily. (1&1/2 Tablet)    [provider]  Cranberry 500 MG CAPS Take 1 capsule by mouth daily.    [provider]  Melatonin 3 MG TABS Take 2 tablets by mouth at bedtime.    [provider]  mirtazapine (REMERON) 15 MG tablet Take 15 mg by mouth at bedtime.      [provider]  polyethylene glycol powder (GLYCOLAX/MIRALAX) powder Take 17 g by mouth every other day. 1 cap  full in a full glass of water, every other day, until discontinued by your doctor. 05/13/18   Sharman Cheek, MD  psyllium (HYDROCIL/METAMUCIL) 95 % PACK Take 1 packet by mouth 2 (two) times daily.    [provider]    Allergies  Allergen Reactions  . Codeine     Unknown reaction per patient  . Hydrocodone Other (See Comments)  . Quetiapine Other (See Comments)    Per daughter pt has severe hallucinations  . Shellfish Allergy Other (See Comments)    Nauseated/vomitting    No family history on file.  Social History Social History   Tobacco Use  . Smoking status: Former Smoker    Packs/day: 1.00    Years: 2.00    Pack years: 2.00    Types: Cigarettes  . Smokeless tobacco: Never Used  Substance Use Topics  . Alcohol use: Yes    Alcohol/week: 1.0 standard drinks    Types: 1 Glasses of wine per week  . Drug use: No    Review of Systems Constitutional: Negative for LOC. Cardiovascular: Negative for chest pain. Respiratory: Negative for shortness of breath. Gastrointestinal: Negative for abdominal pain Musculoskeletal: Pain to the back of her head, mid back. Skin: Negative for skin complaints  Neurological: Pain to back of head. All other  ROS negative  ____________________________________________   PHYSICAL EXAM:  VITAL SIGNS: ED Triage Vitals [07/03/18 2109]  Enc Vitals Group     BP (!) 141/75     Pulse Rate 78     Resp 20     Temp 98.1 F (36.7 C)     Temp Source Oral     SpO2 98 %     Weight 112 lb (50.8 kg)     Height      Head Circumference      Peak Flow      Pain Score 5     Pain Loc      Pain Edu?      Excl. in GC?    Constitutional: Alert and oriented. Well appearing and in no distress. Eyes: Normal exam ENT   Head: Small hematoma to occipital scalp.   Mouth/Throat: Mucous membranes are moist. Cardiovascular: Normal rate, regular rhythm.  Respiratory: Normal respiratory effort without tachypnea nor retractions. Breath  sounds are clear Gastrointestinal: Soft and nontender. No distention. Musculoskeletal: No C-spine tenderness, mild mid T-spine tenderness, no L-spine tenderness. Neurologic:  Normal speech and language. No gross focal neurologic deficits  Skin:  Skin is warm, dry and intact.  Psychiatric: Mood and affect are normal.   ____________________________________________    RADIOLOGY  CT scans are negative. X-rays are negative  ____________________________________________   INITIAL IMPRESSION / ASSESSMENT AND PLAN / ED COURSE  Pertinent labs & imaging results that were available during my care of the patient were reviewed by me and considered in my medical decision making (see chart for details).  Presents to the emergency department after a witnessed fall falling backwards off her chair hitting the back of her head and back on the wall.  Will obtain CT imaging of the head and neck as well as x-rays of the T and L-spine as a precaution.  Overall the patient appears well.  Daughter states she has advanced vascular dementia and over the past week has had increased behavioral disturbances, we will check a urinalysis as a precaution.  Currently the patient appears very well.  CT scans and x-rays are negative.  Patient does have mild tenderness in the mid T-spine however likely due to contusion.  Patient will follow-up with her primary care doctor.  Overall patient appears very well.  Urinalysis is normal.  We will discharge from the emergency department.  ____________________________________________   FINAL CLINICAL IMPRESSION(S) / ED DIAGNOSES  Fall Contusions    Minna AntisPaduchowski, Marguetta Windish, MD 07/03/18 2227

## 2018-08-16 ENCOUNTER — Emergency Department

## 2018-08-16 ENCOUNTER — Emergency Department
Admission: EM | Admit: 2018-08-16 | Discharge: 2018-08-16 | Disposition: A | Discharge status: Home / Self Care | Attending: Emergency Medicine | Admitting: Emergency Medicine

## 2018-08-16 ENCOUNTER — Encounter: Payer: Self-pay | Admitting: Emergency Medicine

## 2018-08-16 DIAGNOSIS — S0081XA Abrasion of other part of head, initial encounter: Secondary | ICD-10-CM | POA: Diagnosis not present

## 2018-08-16 DIAGNOSIS — S6992XA Unspecified injury of left wrist, hand and finger(s), initial encounter: Secondary | ICD-10-CM | POA: Diagnosis present

## 2018-08-16 DIAGNOSIS — Z87891 Personal history of nicotine dependence: Secondary | ICD-10-CM | POA: Diagnosis not present

## 2018-08-16 DIAGNOSIS — W010XXA Fall on same level from slipping, tripping and stumbling without subsequent striking against object, initial encounter: Secondary | ICD-10-CM | POA: Insufficient documentation

## 2018-08-16 DIAGNOSIS — S0083XA Contusion of other part of head, initial encounter: Secondary | ICD-10-CM | POA: Insufficient documentation

## 2018-08-16 DIAGNOSIS — Y998 Other external cause status: Secondary | ICD-10-CM | POA: Diagnosis not present

## 2018-08-16 DIAGNOSIS — Y9289 Other specified places as the place of occurrence of the external cause: Secondary | ICD-10-CM | POA: Insufficient documentation

## 2018-08-16 DIAGNOSIS — Y9389 Activity, other specified: Secondary | ICD-10-CM | POA: Insufficient documentation

## 2018-08-16 DIAGNOSIS — M25511 Pain in right shoulder: Secondary | ICD-10-CM | POA: Diagnosis not present

## 2018-08-16 DIAGNOSIS — F0390 Unspecified dementia without behavioral disturbance: Secondary | ICD-10-CM | POA: Insufficient documentation

## 2018-08-16 DIAGNOSIS — S62522A Displaced fracture of distal phalanx of left thumb, initial encounter for closed fracture: Secondary | ICD-10-CM | POA: Insufficient documentation

## 2018-08-16 DIAGNOSIS — W19XXXA Unspecified fall, initial encounter: Secondary | ICD-10-CM

## 2018-08-16 LAB — CBC
HEMATOCRIT: 38.1 % (ref 36.0–46.0)
HEMOGLOBIN: 12.7 g/dL (ref 12.0–15.0)
MCH: 31.4 pg (ref 26.0–34.0)
MCHC: 33.3 g/dL (ref 30.0–36.0)
MCV: 94.3 fL (ref 80.0–100.0)
Platelets: 220 10*3/uL (ref 150–400)
RBC: 4.04 MIL/uL (ref 3.87–5.11)
RDW: 13.2 % (ref 11.5–15.5)
WBC: 7.4 10*3/uL (ref 4.0–10.5)
nRBC: 0 % (ref 0.0–0.2)

## 2018-08-16 LAB — BASIC METABOLIC PANEL
Anion gap: 10 (ref 5–15)
BUN: 13 mg/dL (ref 8–23)
CHLORIDE: 97 mmol/L — AB (ref 98–111)
CO2: 26 mmol/L (ref 22–32)
CREATININE: 0.85 mg/dL (ref 0.44–1.00)
Calcium: 8.9 mg/dL (ref 8.9–10.3)
GFR calc Af Amer: >60 mL/min (ref >60)
GFR calc non Af Amer: >60 mL/min (ref >60)
GLUCOSE: 107 mg/dL — AB (ref 70–99)
POTASSIUM: 4.3 mmol/L (ref 3.5–5.1)
Sodium: 133 mmol/L — ABNORMAL LOW (ref 135–145)

## 2018-08-16 MED ORDER — BACITRACIN-NEOMYCIN-POLYMYXIN 400-5-5000 EX OINT: TOPICAL_OINTMENT | Freq: Once | CUTANEOUS | Status: DC

## 2018-08-16 MED ORDER — TRIPLE ANTIBIOTIC 5-400-5000 EX OINT: TOPICAL_OINTMENT | Freq: Three times a day (TID) | CUTANEOUS | 0 refills | Status: DC

## 2018-08-16 MED ORDER — ACETAMINOPHEN 325 MG PO TABS
650.0000 mg | ORAL_TABLET | Freq: Once | ORAL | Status: AC
  Administered 2018-08-16: 650 mg via ORAL
  Filled 2018-08-16: qty 2

## 2018-08-16 NOTE — ED Provider Notes (Addendum)
[facility] Emergency Department Provider Note  ____________________________________________  Time seen: Approximately 6:19 PM  I have reviewed the triage vital signs and the nursing notes.   HISTORY  Chief Complaint Fall    HPI Vanessa Jackson is a 85 y.o. female lives in a memory care unit with end-stage dementia, supposed to use a walker but does not always do so, presenting for a fall.  The patient does not remember anything about the fall due to her dementia.  According to her daughter, who accompanies her, she is at her normal mental baseline.  Her daughter states that she was called by the nursing home that the patient fell while walking down the hall but we do not have any additional information.  The patient reports pain in the right shoulder at this time.  Past Medical History:  Diagnosis Date  . Anxiety   . Anxiety   . Dementia (HCC)   . Depression   . Hypertension   . Syncope     Patient Active Problem List   Diagnosis Date Noted  . Syncope 06/14/2011    Past Surgical History:  Procedure Laterality Date  . APPENDECTOMY      Current Outpatient Rx  . Order #: [id number] Class: Historical Med  . Order #: [id number] Class: Historical Med  . Order #: [id number] Class: Historical Med  . Order #: [id number] Class: Historical Med  . Order #: [id number] Class: Historical Med  . Order #: [id number] Class: Historical Med  . Order #: [id number] Class: Historical Med  . Order #: [id number] Class: Historical Med  . Order #: [id number] Class: Print  . Order #: [id number] Class: Print  . Order #: [id number] Class: Historical Med    Allergies Codeine; Hydrocodone; Quetiapine; and Shellfish allergy  No family history on file.  Social History Social History   Tobacco Use  . Smoking status: Former Smoker    Packs/day: 1.00    Years: 2.00    Pack years: 2.00    Types: Cigarettes  . Smokeless tobacco: Never Used  Substance Use Topics  . Alcohol use: Yes     Alcohol/week: 1.0 standard drinks    Types: 1 Glasses of wine per week  . Drug use: No    Review of Systems Limited due to patient's dementia.  ____________________________________________   PHYSICAL EXAM:  VITAL SIGNS: ED Triage Vitals  Enc Vitals Group     BP 08/16/18 1558 (!) 147/88     Pulse Rate 08/16/18 1558 84     Resp 08/16/18 1558 18     Temp 08/16/18 1558 98.1 F (36.7 C)     Temp Source 08/16/18 1558 Oral     SpO2 08/16/18 1558 96 %     Weight 08/16/18 1603 110 lb (49.9 kg)     Height 08/16/18 1603 5\' 1"  (1.549 m)     Head Circumference --      Peak Flow --      Pain Score 08/16/18 1602 6     Pain Loc --      Pain Edu? --      Excl. in GC? --     Constitutional: The patient is alert to person although she does not remember the incident.  GCS is 15. Eyes: Conjunctivae are normal.  EOMI. No scleral icterus. Head: Patient does have a large area of ecchymosis on the right forehead involving the right orbit with some overlying abrasion.. Nose: No congestion/rhinnorhea.  No septal hematoma. Mouth/Throat: Mucous membranes are mildly dry.  No malocclusion or dental injury..Marland Kitchen  Neck: No stridor.  Supple.  No midline C-spine tenderness to palpation, step-offs or deformities. Cardiovascular: Normal rate, regular rhythm. No murmurs, rubs or gallops.  Respiratory: Normal respiratory effort.  No accessory muscle use or retractions. Lungs CTAB.  No wheezes, rales or ronchi. Gastrointestinal: Soft, nontender and nondistended.  No guarding or rebound.  No peritoneal signs. Musculoskeletal: Pelvis is stable.  Full range of motion of the bilateral hips, knees, and ankles without pain.  Full range of motion of the left shoulder, bilateral elbows and bilateral wrists without pain.  The patient does have some decreased range of motion and mild pain in the right shoulder.  She also has bruising and swelling over the distal left thumb with full flexion at both joints.  Normal radial pulses  bilaterally. Neurologic:  Alert.  Speech is clear.  Face and smile are symmetric.  EOMI.  Moves all extremities well. Skin:  Skin is warm, dry and intact. No rash noted. Psychiatric: Mood and affect are normal.  ____________________________________________   LABS (all labs ordered are listed, but only abnormal results are displayed)  Labs Reviewed  BASIC METABOLIC PANEL - Abnormal; Notable for the following components:      Result Value   Sodium 133 (*)    Chloride 97 (*)    Glucose, Bld 107 (*)    All other components within normal limits  CBC  URINALYSIS, COMPLETE (UACMP) WITH MICROSCOPIC   ____________________________________________  EKG  ED ECG REPORT I, Anne-Caroline Sharma Covert, the attending physician, personally viewed and interpreted this ECG.   Date: 08/16/2018  EKG Time: 1606  Rate: 82  Rhythm: normal sinus rhythm  Axis: normal  Intervals:none  ST&T Change: No STEMI  ____________________________________________  RADIOLOGY  Dg Shoulder Right  Result Date: 08/16/2018 CLINICAL DATA:  Fall, dementia, shoulder pain EXAM: RIGHT SHOULDER - 2+ VIEW COMPARISON:  12/31/2017 FINDINGS: Chronic healed deformity of a previous right proximal humerus impacted surgical neck fracture. Similar to the 12/31/2017 chest x-ray. Bones are osteopenic. AC joint aligned. Right ninth and tenth rib fractures, appearing subacute to chronic with callus formation. Visualize right upper lobe is clear. IMPRESSION: Chronic healed deformity of the proximal right humerus. No gross subluxation or dislocation. Osteopenia Suspect subacute or chronic right rib fractures. Electronically Signed   By: Judie Petit.  Shick M.D.   On: 08/16/2018 19:38   Ct Head Wo Contrast  Result Date: 08/16/2018 CLINICAL DATA:  Possible fall, headache, bruising over the right forehead dementia EXAM: CT HEAD WITHOUT CONTRAST TECHNIQUE: Contiguous axial images were obtained from the base of the skull through the vertex without intravenous  contrast. COMPARISON:  CT brain scan of 07/03/2018 FINDINGS: Brain: The ventricular system remains significantly dilated, with prominent cortical sulci diffusely consistent with atrophy. Moderately severe small vessel ischemic changes again noted throughout the periventricular white matter. The septum is midline in position. The fourth ventricle and basilar cisterns are unremarkable. Benign-appearing bilateral basal ganglial calcifications are unchanged. No hemorrhage, mass lesion, or acute infarction is seen. Vascular: No vascular abnormality is noted on this unenhanced study. Skull: There is a right frontal scalp hematoma present. However no underlying calvarial abnormality is seen. Sinuses/Orbits: Air-fluid level is noted within the left maxillary sinus consistent with sinusitis. No other paranasal sinus abnormality is noted. Other: None. IMPRESSION: 1. No acute intracranial abnormality. 2. Stable moderately severe atrophy and small vessel ischemic change throughout the periventricular white matter. 3. Left maxillary sinus disease. Electronically Signed   By: Dwyane Dee M.D.   On: 08/16/2018 16:25  Dg Finger Thumb Left  Result Date: 08/16/2018 CLINICAL DATA:  Fall, injury, pain EXAM: LEFT THUMB 2+V COMPARISON:  None. FINDINGS: There is an acute nondisplaced fracture of the left thumb distal phalanx just distal to the IP joint. Bones are osteopenic. No joint abnormality. Mild soft tissue swelling IMPRESSION: Acute nondisplaced fracture left thumb distal phalanx. Electronically Signed   By: Judie Petit.  Shick M.D.   On: 08/16/2018 19:40    ____________________________________________   PROCEDURES  Procedure(s) performed: None  .Splint Application Date/Time: 08/16/2018 7:59 PM Performed by: Rockne Menghini, MD Authorized by: Rockne Menghini, MD   Consent:    Consent obtained:  Verbal   Consent given by:  Patient   Risks discussed:  Numbness, pain and swelling   Alternatives discussed:  No  treatment Pre-procedure details:    Sensation:  Normal Procedure details:    Laterality:  Left   Location:  Finger   Finger:  L thumb   Supplies:  Aluminum splint Post-procedure details:    Pain:  Unchanged   Sensation:  Normal   Patient tolerance of procedure:  Tolerated well, no immediate complications    Critical Care performed: No ____________________________________________   INITIAL IMPRESSION / ASSESSMENT AND PLAN / ED COURSE  Pertinent labs & imaging results that were available during my care of the patient were reviewed by me and considered in my medical decision making (see chart for details).  85 y.o. email from a memory care unit with end-stage dementia presenting with fall, obvious head injury and right shoulder pain.  The patient does not remember the fall, and I do not have any information about whether she had loss of consciousness or associated syncope.  However at this time, I do not see any evidence of arrhythmia or abnormal vital signs.  In addition, her laboratory studies are reassuring without any anemia or severe electrolyte disturbance.  Urinalysis is pending.  The patient's trauma work-up has included a CT of the head which does not show any acute intracranial process.  She does not have any evidence of entrapment and there were no obvious facial fractures on her CT; a maxillofacial CT is not indicated at this time.  We will get imaging of the patient's right shoulder and left thumb.  Tylenol has been ordered for her comfort.  Plan reevaluation for final disposition.  ----------------------------------------- 7:57 PM on 08/16/2018 -----------------------------------------  The patient shoulder does not show any acute fractures.  She does have a fracture of the distal phalanx in the left thumb, which I will splint and have her follow-up with the orthopedist in 1 week.  At this time, the patient safe for discharge home.  Follow-up instructions and return  precautions were discussed with the patient and her daughter, as well as written on her discharge instructions.  ____________________________________________  FINAL CLINICAL IMPRESSION(S) / ED DIAGNOSES  Final diagnoses:  Closed displaced fracture of distal phalanx of left thumb, initial encounter  Fall, initial encounter  Contusion of face, initial encounter  Abrasion, face w/o infection  Acute pain of right shoulder         NEW MEDICATIONS STARTED DURING THIS VISIT:  New Prescriptions   NEOMYCIN-BACITRACIN-POLYMYXIN (NEOSPORIN) [phone] OINTMENT    Apply topically 3 (three) times daily. To facial abrasions      Rockne Menghini, MD 08/16/18 [place]    Rockne Menghini, MD 08/16/18 2000

## 2018-08-16 NOTE — Discharge Instructions (Signed)
Please keep the splint on your thumb at all times to help it heal.  Please make an appointment with the orthopedist in 1 week for reevaluation.  Please apply Neosporin and a thick coat to the facial abrasions 3 times daily until they have completely healed.  Return to the emergency department for severe pain, vomiting, changes in mental status, or for any other symptoms concerning to you.

## 2018-08-16 NOTE — ED Notes (Signed)
This Clinical research associate assisted patient to restroom. Patient back in bed at this time.

## 2018-08-16 NOTE — ED Notes (Signed)
Discharge info reviewed with patient and patient's daughter. Daughter states understanding and signed discharge.

## 2018-08-16 NOTE — ED Triage Notes (Signed)
Patient presents to the ED via EMS from [facility].  Patient states she thinks she slipped but is unsure of how or when or what she hit her head on.  Patient has bruising to her right forehead.  Patient has history of dementia.  Fall was unwitnessed.

## 2018-09-13 ENCOUNTER — Emergency Department
Admission: EM | Admit: 2018-09-13 | Discharge: 2018-09-13 | Disposition: A | Discharge status: Home / Self Care | Attending: Emergency Medicine | Admitting: Emergency Medicine

## 2018-09-13 ENCOUNTER — Emergency Department

## 2018-09-13 ENCOUNTER — Other Ambulatory Visit: Payer: Self-pay

## 2018-09-13 ENCOUNTER — Encounter: Payer: Self-pay | Admitting: Emergency Medicine

## 2018-09-13 DIAGNOSIS — Y939 Activity, unspecified: Secondary | ICD-10-CM | POA: Diagnosis not present

## 2018-09-13 DIAGNOSIS — Z87891 Personal history of nicotine dependence: Secondary | ICD-10-CM | POA: Insufficient documentation

## 2018-09-13 DIAGNOSIS — F0390 Unspecified dementia without behavioral disturbance: Secondary | ICD-10-CM | POA: Diagnosis not present

## 2018-09-13 DIAGNOSIS — S51012A Laceration without foreign body of left elbow, initial encounter: Secondary | ICD-10-CM

## 2018-09-13 DIAGNOSIS — Z79899 Other long term (current) drug therapy: Secondary | ICD-10-CM | POA: Diagnosis not present

## 2018-09-13 DIAGNOSIS — W19XXXA Unspecified fall, initial encounter: Secondary | ICD-10-CM

## 2018-09-13 DIAGNOSIS — W1789XA Other fall from one level to another, initial encounter: Secondary | ICD-10-CM | POA: Diagnosis not present

## 2018-09-13 DIAGNOSIS — I10 Essential (primary) hypertension: Secondary | ICD-10-CM | POA: Diagnosis not present

## 2018-09-13 DIAGNOSIS — T148XXA Other injury of unspecified body region, initial encounter: Secondary | ICD-10-CM | POA: Diagnosis not present

## 2018-09-13 DIAGNOSIS — Y92129 Unspecified place in nursing home as the place of occurrence of the external cause: Secondary | ICD-10-CM | POA: Insufficient documentation

## 2018-09-13 DIAGNOSIS — R102 Pelvic and perineal pain: Secondary | ICD-10-CM | POA: Insufficient documentation

## 2018-09-13 DIAGNOSIS — Y999 Unspecified external cause status: Secondary | ICD-10-CM | POA: Diagnosis not present

## 2018-09-13 DIAGNOSIS — Z7982 Long term (current) use of aspirin: Secondary | ICD-10-CM | POA: Insufficient documentation

## 2018-09-13 DIAGNOSIS — R1032 Left lower quadrant pain: Secondary | ICD-10-CM | POA: Diagnosis present

## 2018-09-13 DIAGNOSIS — S20212A Contusion of left front wall of thorax, initial encounter: Secondary | ICD-10-CM

## 2018-09-13 LAB — CBC WITH DIFFERENTIAL/PLATELET
Abs Immature Granulocytes: 0.02 10*3/uL (ref 0.00–0.07)
BASOS ABS: 0 10*3/uL (ref 0.0–0.1)
Basophils Relative: 0 %
Eosinophils Absolute: 0.4 10*3/uL (ref 0.0–0.5)
Eosinophils Relative: 5 %
HEMATOCRIT: 38.2 % (ref 36.0–46.0)
Hemoglobin: 12.9 g/dL (ref 12.0–15.0)
Immature Granulocytes: 0 %
Lymphocytes Relative: 23 %
Lymphs Abs: 1.7 10*3/uL (ref 0.7–4.0)
MCH: 31.9 pg (ref 26.0–34.0)
MCHC: 33.8 g/dL (ref 30.0–36.0)
MCV: 94.6 fL (ref 80.0–100.0)
Monocytes Absolute: 0.3 10*3/uL (ref 0.1–1.0)
Monocytes Relative: 5 %
Neutro Abs: 4.7 10*3/uL (ref 1.7–7.7)
Neutrophils Relative %: 67 %
Platelets: 191 10*3/uL (ref 150–400)
RBC: 4.04 MIL/uL (ref 3.87–5.11)
RDW: 13.3 % (ref 11.5–15.5)
WBC: 7.1 10*3/uL (ref 4.0–10.5)
nRBC: 0 % (ref 0.0–0.2)

## 2018-09-13 LAB — COMPREHENSIVE METABOLIC PANEL
ALT: 18 U/L (ref 0–44)
AST: 27 U/L (ref 15–41)
Albumin: 4.2 g/dL (ref 3.5–5.0)
Alkaline Phosphatase: 57 U/L (ref 38–126)
Anion gap: 7 (ref 5–15)
BUN: 10 mg/dL (ref 8–23)
CO2: 28 mmol/L (ref 22–32)
Calcium: 9.3 mg/dL (ref 8.9–10.3)
Chloride: 97 mmol/L — ABNORMAL LOW (ref 98–111)
Creatinine, Ser: 0.59 mg/dL (ref 0.44–1.00)
GFR calc Af Amer: >60 mL/min (ref >60)
GFR calc non Af Amer: >60 mL/min (ref >60)
Glucose, Bld: 111 mg/dL — ABNORMAL HIGH (ref 70–99)
Potassium: 4.3 mmol/L (ref 3.5–5.1)
Sodium: 132 mmol/L — ABNORMAL LOW (ref 135–145)
TOTAL PROTEIN: 7.1 g/dL (ref 6.5–8.1)
Total Bilirubin: 1.5 mg/dL — ABNORMAL HIGH (ref 0.3–1.2)

## 2018-09-13 MED ORDER — SODIUM CHLORIDE 0.9 % IV BOLUS
500.0000 mL | Freq: Once | INTRAVENOUS | Status: AC
  Administered 2018-09-13: 500 mL via INTRAVENOUS

## 2018-09-13 MED ORDER — IOHEXOL 300 MG/ML  SOLN
75.0000 mL | Freq: Once | INTRAMUSCULAR | Status: AC | PRN
  Administered 2018-09-13: 75 mL via INTRAVENOUS

## 2018-09-13 NOTE — Progress Notes (Signed)
ED visit made. Patient is currently followed by hosp[facility] at [facility] with a hospice diagnosis of Cerebral vascular disease. She is a DNR code with out of facility DNR in place at facility. Patient was sent to the [facility] ED today via EMS for evaluation of hip/pelvis pain following 2 falls on 2/5. Chart notes reviewed, all CT results negative. Writer spoke with attending EDP Dr. Langston Masker and staff RN Viviann Spare. Plan is for discharge back to [facility]. Patient seen lying on the ED stretcher, appeared to be agitated. Patient had removed her IV, staff RN notified. Writer provided active listening and emotional support. Patient requested to get up to go to the bathroom, staff RN Viviann Spare notified. Phone call to patient's daughter Rosey Bath, she voiced understanding that CT results were negative and that patient would be discharging. She reports that she would be on her way to pick her mother up and transport her back to [place]. Pateint provided with chocolate ice cream and was feeding herself at end of visit. Hospital care team updated. Hospice case manager updated.  Dayna Barker RN, BSN, [facility] [place] Liaison [phone]

## 2018-09-13 NOTE — ED Triage Notes (Signed)
Pt fell yesterday at 3pm witness and had a skin tear treated at [facility]. Then she was found on the floor at 7pm and helped back to bed yesterday. Today she c/o left lower hip/pelvis pain.

## 2018-09-13 NOTE — ED Notes (Signed)
Family at bedside. RN updating at this time. Pt resting in bed and in NAD at this time.

## 2018-09-13 NOTE — ED Notes (Signed)
This RN ambulated patient with minimal assistance to toilet, pt with steady gait, diaper changed, repositioned in bed and given chocolate ice cream.  Pt denies needs at this time.

## 2018-09-13 NOTE — ED Provider Notes (Addendum)
[facility] Emergency Department Provider Note  ____________________________________________   First MD Initiated Contact with Patient 09/13/18 1247     (approximate)  I have reviewed the triage vital signs and the nursing notes.   HISTORY  Chief Complaint Fall   HPI Vanessa Jackson is a 85 y.o. female with a history of dementia, depression on hospice with frequent falls who is presenting after a fall today to the emergency department.  EMS reports the patient had 2 falls yesterday at 3 PM and then at 7 PM.  Details are not available for either fall because of the patient's dementia she is unable to recall the circumstances.  The patient sustained a skin tear at that time.  However, was sent in today secondary to left-sided flank and pelvic pain.  No reports of altered mental status at this time.   Past Medical History:  Diagnosis Date  . Anxiety   . Anxiety   . Dementia (HCC)   . Depression   . Hypertension   . Syncope     Patient Active Problem List   Diagnosis Date Noted  . Syncope 06/14/2011    Past Surgical History:  Procedure Laterality Date  . APPENDECTOMY      Prior to Admission medications   Medication Sig Start Date End Date Taking? Authorizing Provider  acetaminophen (TYLENOL) 325 MG tablet Take 650 mg by mouth 3 (three) times daily.    Yes [provider]  amLODipine (NORVASC) 10 MG tablet Take 10 mg by mouth daily. 07/20/18 07/20/19 Yes [provider]  aspirin EC 81 MG tablet Take 81 mg by mouth daily.   Yes [provider]  atorvastatin (LIPITOR) 20 MG tablet Take 20 mg by mouth at bedtime.   Yes [provider]  B Complex-C-Folic Acid TABS Take 1 tablet by mouth daily.   Yes [provider]  Calcium Carbonate-Vitamin D3 (CALCIUM 600-D) 600-400 MG-UNIT TABS Take 1 tablet by mouth daily.   Yes [provider]  Capsaicin-Menthol (SALONPAS GEL EX) Apply topically 2 (two) times  daily.   Yes [provider]  celecoxib (CELEBREX) 100 MG capsule Take 100 mg by mouth 2 (two) times daily.   Yes [provider]  citalopram (CELEXA) 10 MG tablet Take 10 mg by mouth daily. (1&1/2 Tablet)    Yes [provider]  diclofenac sodium (VOLTAREN) 1 % GEL Apply 2 g topically 4 (four) times daily.   Yes [provider]  Melatonin 3 MG TABS Take 2 tablets by mouth at bedtime.   Yes [provider]  Menthol-Methyl Salicylate (BENGAY GREASELESS EX) Apply topically every 6 (six) hours as needed (left hip pain).   Yes [provider]  psyllium (HYDROCIL/METAMUCIL) 95 % PACK Take 1 packet by mouth 2 (two) times daily.   Yes [provider]  senna (SENOKOT) 8.6 MG TABS tablet Take 2 tablets by mouth at bedtime.   Yes [provider]  thiamine (VITAMIN B-1) 50 MG tablet Take 50 mg by mouth daily.   Yes [provider]    Allergies Codeine; Hydrocodone; Olanzapine; Quetiapine; and Shellfish allergy  History reviewed. No pertinent family history.  Social History Social History   Tobacco Use  . Smoking status: Former Smoker    Packs/day: 1.00    Years: 2.00    Pack years: 2.00    Types: Cigarettes  . Smokeless tobacco: Never Used  Substance Use Topics  . Alcohol use: Yes    Alcohol/week:  1.0 standard drinks    Types: 1 Glasses of wine per week  . Drug use: No    Review of Systems  Level 5 caveat secondary to dementia.   ____________________________________________   PHYSICAL EXAM:  VITAL SIGNS: ED Triage Vitals  Enc Vitals Group     BP 09/13/18 1251 (!) 152/73     Pulse Rate 09/13/18 1251 77     Resp 09/13/18 1251 18     Temp 09/13/18 1251 98 F (36.7 C)     Temp Source 09/13/18 1251 Oral     SpO2 09/13/18 1251 95 %     Weight 09/13/18 1252 109 lb 12.6 oz (49.8 kg)     Height 09/13/18 1252 5\' 1"  (1.549 m)     Head Circumference --      Peak Flow --      Pain Score --      Pain Loc  --      Pain Edu? --      Excl. in GC? --     Constitutional: Alert and oriented.  No distress. Eyes: Conjunctivae are normal.  Head: Atraumatic. Nose: No congestion/rhinnorhea. Mouth/Throat: Mucous membranes are moist.  Neck: No stridor.  No tenderness to the midline cervical spine.  No deformity or step-off.  Patient ranging head neck freely. Cardiovascular: Normal rate, regular rhythm. Grossly normal heart sounds.  Respiratory: Normal respiratory effort.  No retractions. Lungs CTAB. Gastrointestinal: Soft and nontender. No distention.  Musculoskeletal: No lower extremity shortening or rotational deformity.  Mild tenderness to palpation over the left side of the pelvis, superiorly but without any instability.  No tenderness to the lumbar nor thoracic spinal regions.  No deformity or step-off to the midline.  Tenderness palpation to the left lateral thorax but without point tenderness, crepitus or ecchymosis.  No tenderness to the chest wall. Neurologic:  Normal speech and language. No gross focal neurologic deficits are appreciated. Skin: Superficial skin tear to the distal forearm. No active bleeding at this time.  Skin tear approximately 2 x 3 cm.  No bony deformity.  Neurovascular intact. Psychiatric: Mood and affect are normal. Speech and behavior are normal.  ____________________________________________   LABS (all labs ordered are listed, but only abnormal results are displayed)  Labs Reviewed  COMPREHENSIVE METABOLIC PANEL - Abnormal; Notable for the following components:      Result Value   Sodium 132 (*)    Chloride 97 (*)    Glucose, Bld 111 (*)    Total Bilirubin 1.5 (*)    All other components within normal limits  CBC WITH DIFFERENTIAL/PLATELET   ____________________________________________  EKG   ____________________________________________  RADIOLOGY  CT head without acute findings.  CT chest without findings of acute trauma.  Multiple chronic appearing  thoracic compression fractures.  Moderate cardiomegaly.  Incidental finding of a 4.7 cm ascending thoracic aortic aneurysm.  Recommend semiannual imaging follow-up.  ____________________________________________   PROCEDURES  Procedure(s) performed:   Procedures  Critical Care performed:   ____________________________________________   INITIAL IMPRESSION / ASSESSMENT AND PLAN / ED COURSE  Pertinent labs & imaging results that were available during my care of the patient were reviewed by me and considered in my medical decision making (see chart for details).  DDX: Skin tear, hip fracture, rib fracture, pneumothorax, splenic fracture As part of my medical decision making, I reviewed the following data within the electronic MEDICAL RECORD NUMBER Notes from prior ED visits  ----------------------------------------- 3:53 PM on 09/13/2018 -----------------------------------------  I had a discussion  with the patient's daughter, Teressa Lower who also spoke with the hospice representative.  We discussed the fall as well as the imaging.  Patient's daughter is on her way to the hospital now.  However, the patient is in no distress at this time.  Is ambulating throughout the room without issue.  Daughter is not requesting any further work-up at this time.  The daughter is also aware of the thoracic aortic aneurysm.  However, it is unlikely that the patient would be a candidate for any intervention given her advanced age and dementia.  Likely mechanical fall with patient sustaining a contusion.  However, does not appear to have any serious internal injury.  Will defer further work-up for syncope or infectious source.  Reassuring vital signs.  Patient not in any distress.  Reassuring labs as well.  Patient's daughter all says that the patient has received a tetanus shot within the last 10 years. ____________________________________________   FINAL CLINICAL IMPRESSION(S) / ED DIAGNOSES  Fall.   Contusion.  Skin tear.  NEW MEDICATIONS STARTED DURING THIS VISIT:  New Prescriptions   No medications on file     Note:  This document was prepared using Dragon voice recognition software and may include unintentional dictation errors.     Myrna Blazer, MD 09/13/18 1554    Diyana Starrett, Myra Rude, MD 09/13/18 1555    Pershing Proud, Myra Rude, MD 09/13/18 [phone]

## 2019-01-06 ENCOUNTER — Emergency Department
Admission: EM | Admit: 2019-01-06 | Discharge: 2019-01-06 | Disposition: A | Discharge status: Skilled Nursing Facility | Payer: Medicare Other | Attending: Emergency Medicine | Admitting: Emergency Medicine

## 2019-01-06 ENCOUNTER — Other Ambulatory Visit: Payer: Self-pay

## 2019-01-06 ENCOUNTER — Emergency Department: Payer: Medicare Other

## 2019-01-06 ENCOUNTER — Encounter: Payer: Self-pay | Admitting: Intensive Care

## 2019-01-06 DIAGNOSIS — Z87891 Personal history of nicotine dependence: Secondary | ICD-10-CM | POA: Insufficient documentation

## 2019-01-06 DIAGNOSIS — F0390 Unspecified dementia without behavioral disturbance: Secondary | ICD-10-CM | POA: Diagnosis not present

## 2019-01-06 DIAGNOSIS — Z79899 Other long term (current) drug therapy: Secondary | ICD-10-CM | POA: Diagnosis not present

## 2019-01-06 DIAGNOSIS — Z7982 Long term (current) use of aspirin: Secondary | ICD-10-CM | POA: Insufficient documentation

## 2019-01-06 DIAGNOSIS — M79605 Pain in left leg: Secondary | ICD-10-CM | POA: Insufficient documentation

## 2019-01-06 DIAGNOSIS — W19XXXA Unspecified fall, initial encounter: Secondary | ICD-10-CM

## 2019-01-06 DIAGNOSIS — I10 Essential (primary) hypertension: Secondary | ICD-10-CM | POA: Insufficient documentation

## 2019-01-06 NOTE — ED Notes (Signed)
Attempted to call [facility] with no answer

## 2019-01-06 NOTE — Discharge Instructions (Addendum)
Vanessa Jackson was evaluated in the emergency department.  She had a CT of her head which showed no evidence of significant head injury.  X-rays of the left hip show no fracture.  However, it is possible that her hip surgery has not completely healed ("malunion").  Therefore, she needs to follow-up with the orthopedic surgeon who did the procedure to monitor the healing.  She should return to the ER for new or worsening pain, recurrent falls, weakness, or any other new or worsening symptoms that concern her or facility staff.

## 2019-01-06 NOTE — ED Provider Notes (Signed)
[facility] Emergency Department Provider Note ____________________________________________   First MD Initiated Contact with Patient 01/06/19 0825     (approximate)  I have reviewed the triage vital signs and the nursing notes.   HISTORY  Chief Complaint Fall  Level 5 caveat: History of present illness limited due to dementia  HPI Vanessa Jackson is a 85 y.o. female who presents after she had an unwitnessed fall at her facility.  She was found on the floor today.  Per EMS, the patient had complained of some pain to her left leg over the last week.  The patient does not remember falling and has no acute complaints.  She denies any pain currently.  Past Medical History:  Diagnosis Date   Anxiety    Anxiety    Dementia (HCC)    Depression    Hypertension    Syncope     Patient Active Problem List   Diagnosis Date Noted   Syncope 06/14/2011    Past Surgical History:  Procedure Laterality Date   APPENDECTOMY      Prior to Admission medications   Medication Sig Start Date End Date Taking? Authorizing Provider  acetaminophen (TYLENOL) 325 MG tablet Take 650 mg by mouth 3 (three) times daily.     [provider]  amLODipine (NORVASC) 10 MG tablet Take 10 mg by mouth daily. 07/20/18 07/20/19  [provider]  aspirin EC 81 MG tablet Take 81 mg by mouth daily.    [provider]  atorvastatin (LIPITOR) 20 MG tablet Take 20 mg by mouth at bedtime.    [provider]  B Complex-C-Folic Acid TABS Take 1 tablet by mouth daily.    [provider]  Calcium Carbonate-Vitamin D3 (CALCIUM 600-D) 600-400 MG-UNIT TABS Take 1 tablet by mouth daily.    [provider]  Capsaicin-Menthol (SALONPAS GEL EX) Apply topically 2 (two) times daily.    [provider]  celecoxib (CELEBREX) 100 MG capsule Take 100 mg by mouth 2 (two) times daily.    [provider]  citalopram (CELEXA) 10 MG  tablet Take 10 mg by mouth daily. (1&1/2 Tablet)     [provider]  diclofenac sodium (VOLTAREN) 1 % GEL Apply 2 g topically 4 (four) times daily.    [provider]  Melatonin 3 MG TABS Take 2 tablets by mouth at bedtime.    [provider]  Menthol-Methyl Salicylate (BENGAY GREASELESS EX) Apply topically every 6 (six) hours as needed (left hip pain).    [provider]  psyllium (HYDROCIL/METAMUCIL) 95 % PACK Take 1 packet by mouth 2 (two) times daily.    [provider]  senna (SENOKOT) 8.6 MG TABS tablet Take 2 tablets by mouth at bedtime.    [provider]  thiamine (VITAMIN B-1) 50 MG tablet Take 50 mg by mouth daily.    [provider]    Allergies Codeine; Hydrocodone; Olanzapine; Quetiapine; and Shellfish allergy  History reviewed. No pertinent family history.  Social History Social History   Tobacco Use   Smoking status: Former Smoker    Packs/day: 1.00    Years: 2.00    Pack years: 2.00    Types: Cigarettes   Smokeless tobacco: Never Used  Substance Use Topics   Alcohol use: Not Currently    Alcohol/week: 1.0 standard drinks    Types: 1 Glasses of wine per week   Drug use: No    Review of Systems Level 5 caveat:  Unable to obtain review of systems due to dementia    ____________________________________________   PHYSICAL EXAM:  VITAL SIGNS: ED Triage Vitals  Enc Vitals Group     BP 01/06/19 0816 135/74     Pulse Rate 01/06/19 0816 71     Resp 01/06/19 0816 16     Temp 01/06/19 0816 97.9 F (36.6 C)     Temp Source 01/06/19 0816 Oral     SpO2 01/06/19 0816 100 %     Weight 01/06/19 0817 110 lb (49.9 kg)     Height 01/06/19 0817 5' (1.524 m)     Head Circumference --      Peak Flow --      Pain Score --      Pain Loc --      Pain Edu? --      Excl. in GC? --     Constitutional: Alert, oriented to person. Well appearing for age and in no acute distress. Eyes: Conjunctivae are  normal.  EOMI.  PERRLA. Head: Atraumatic. Nose: No congestion/rhinnorhea. Mouth/Throat: Mucous membranes are moist.   Neck: Normal range of motion.  No midline cervical spinal tenderness. Cardiovascular: Normal rate, regular rhythm. Good peripheral circulation. Respiratory: Normal respiratory effort.  No retractions.  Gastrointestinal: Soft and nontender. No distention.  Genitourinary: No flank tenderness. Musculoskeletal: No lower extremity edema.  Full range of motion at bilateral hips and knees as well as bilateral shoulders and elbows.  No bony tenderness to the left hip or femur.  No midline spinal tenderness.  Extremities warm and well perfused.  Neurologic:  Normal speech and language.  Motor intact in all extremities.  Normal coordination.  No gross focal neurologic deficits are appreciated.  Skin:  Skin is warm and dry. No rash noted. Psychiatric: Calm and cooperative.  ____________________________________________   LABS (all labs ordered are listed, but only abnormal results are displayed)  Labs Reviewed - No data to display ____________________________________________  EKG   ____________________________________________  RADIOLOGY  CT head: No ICH XR L hip: No acute fracture.  Possible malunion of old ORIF.  ____________________________________________   PROCEDURES  Procedure(s) performed: No  Procedures  Critical Care performed: No ____________________________________________   INITIAL IMPRESSION / ASSESSMENT AND PLAN / ED COURSE  Pertinent labs & imaging results that were available during my care of the patient were reviewed by me and considered in my medical decision making (see chart for details).  85 year old female with a history of dementia presents from her facility after an apparent unwitnessed fall.  In addition the patient had reportedly been complaining of left leg pain over the last week.  Currently the patient does not remember any fall and is  without complaint.  When asked about the left leg specifically she states "I tend not to do well on pain" and denies any pain there now.  On exam she is very well-appearing for her age and her vital signs are normal.  She has no visible trauma and has full range of motion at all large joints.  There is no midline spinal tenderness.  Neurologic exam is nonfocal.  Based on the patient's normal vitals, reassuring exam, and lack of systemic symptoms there is no indication for lab work-up.  I suspect most likely mechanical fall.  Since we cannot verify whether she hit her head I will obtain a CT.  In addition because she had been complaining of left leg pain and her history is unreliable due to dementia, I will obtain x-rays of  the left hip and pelvis.  ----------------------------------------- 9:53 AM on 01/06/2019 -----------------------------------------  CT head is negative.  X-ray of the hip shows no evidence of fracture, however there is some concern for possible nonunion of an old ORIF performed last year.  This does not require acute intervention.  The patient is moving the joint without difficulty.  She will need outpatient orthopedic evaluation.  We will contact the facility, and I have provided written discharge instructions to this effect as well as return precautions.   ____________________________________________   FINAL CLINICAL IMPRESSION(S) / ED DIAGNOSES  Final diagnoses:  Fall, initial encounter      NEW MEDICATIONS STARTED DURING THIS VISIT:  New Prescriptions   No medications on file     Note:  This document was prepared using Dragon voice recognition software and may include unintentional dictation errors.    Dionne Bucy, MD 01/06/19 [phone]

## 2019-01-06 NOTE — ED Triage Notes (Addendum)
Fall yesterday unwitnessed from memory care [facility]. Left leg pain. Pt able to hold up both legs in bed. C/o pain to Left leg X1 week. EMS vitals 150/80, 64HR, 97RA. HX dementia. Received Tylenol from [facility] staff today for pain

## 2019-01-06 NOTE — ED Notes (Signed)
Updated patients daughter Rosey Bath on patients being d/c and status

## 2019-01-06 NOTE — ED Notes (Signed)
EMS here to transport patient at this time.

## 2020-09-04 IMAGING — CT CT HEAD W/O CM
3 series · 15 of 47 positions shown, 18 images · non-contrast
Comparison: CT brain scan of 07/03/2018

CLINICAL DATA: Possible fall, headache, bruising over the right
forehead dementia

EXAM:
CT HEAD WITHOUT CONTRAST
TECHNIQUE: Contiguous axial images were obtained from the base of the skull
through the vertex without intravenous contrast.

[Series 3: head wo · axial · 0.42mm/px · z∈[-100,+25]mm · 9 of 31 slices shown, 12 images]
[im 3/31  brain]
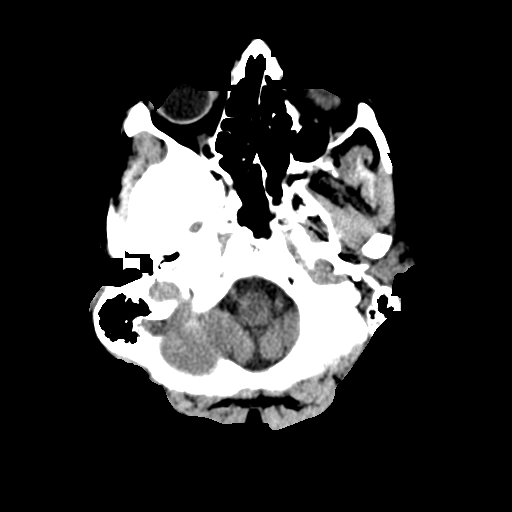
[im 3/31  bone]
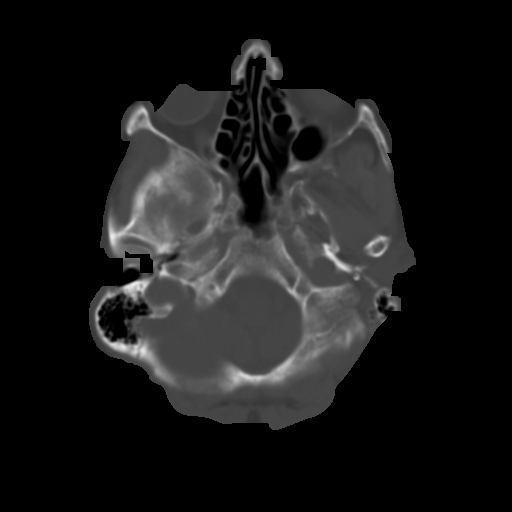
[im 6/31  brain]
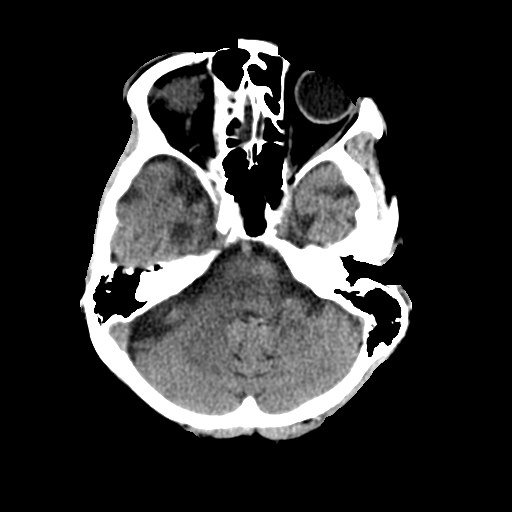
[im 9/31  brain]
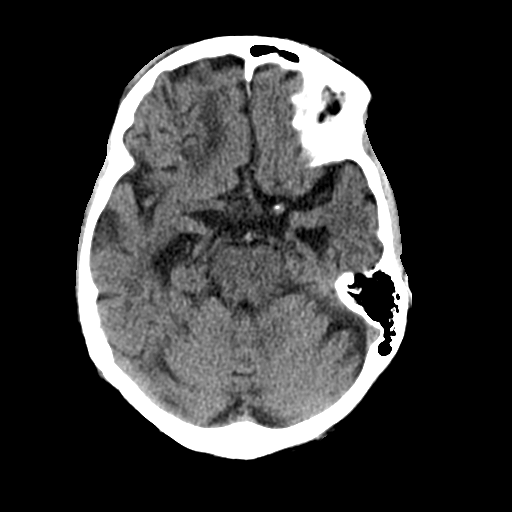
[im 12/31  brain]
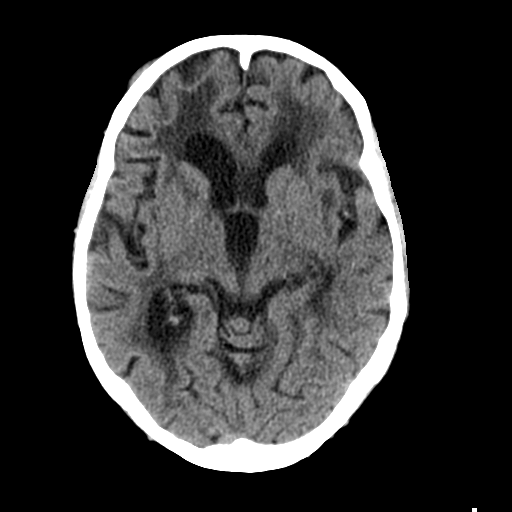
[im 16/31  brain]
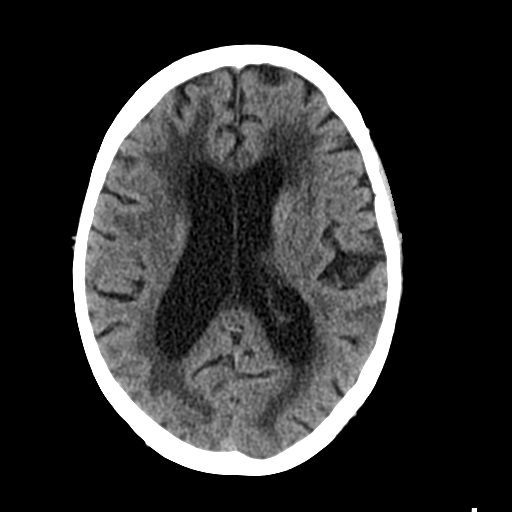
[im 16/31  bone]
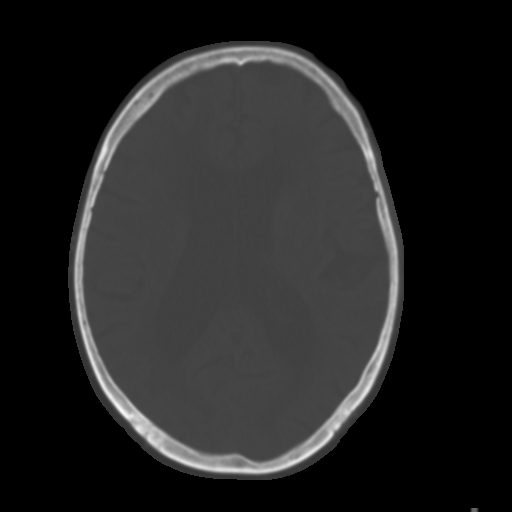
[im 19/31  brain]
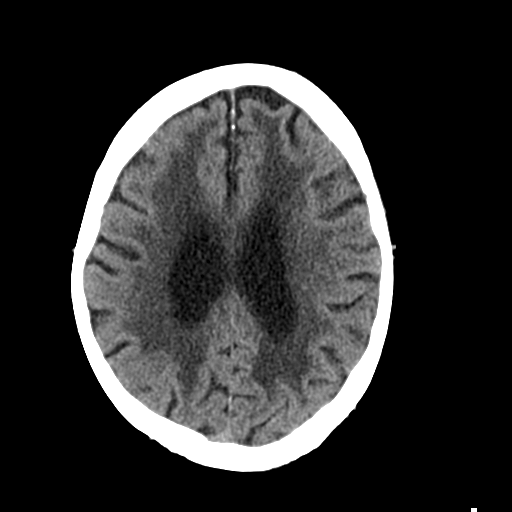
[im 22/31  brain]
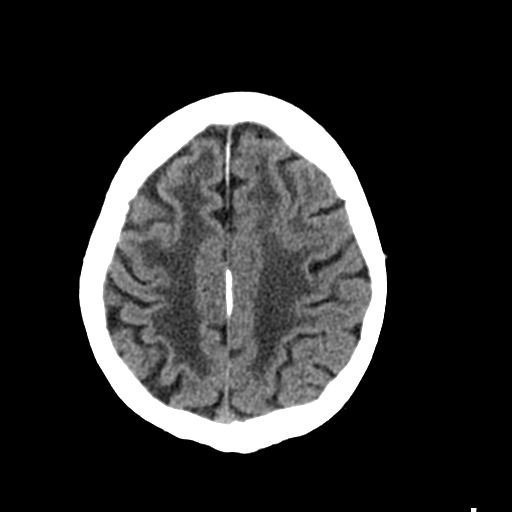
[im 25/31  brain]
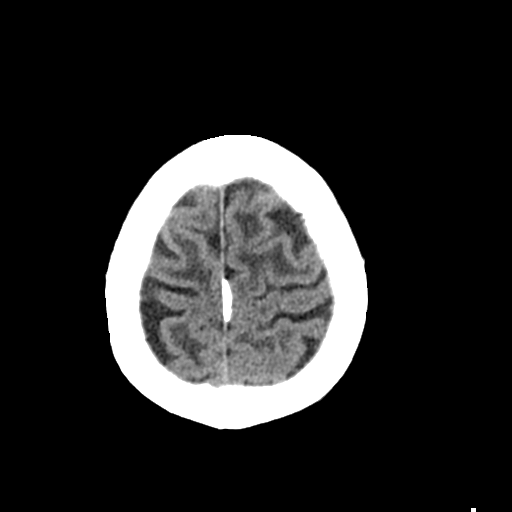
[im 28/31  brain]
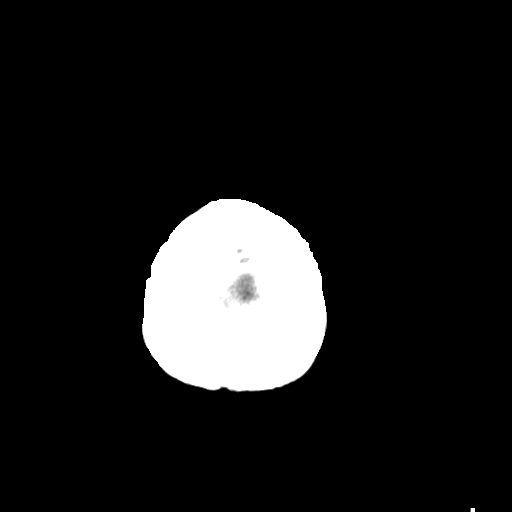
[im 28/31  bone]
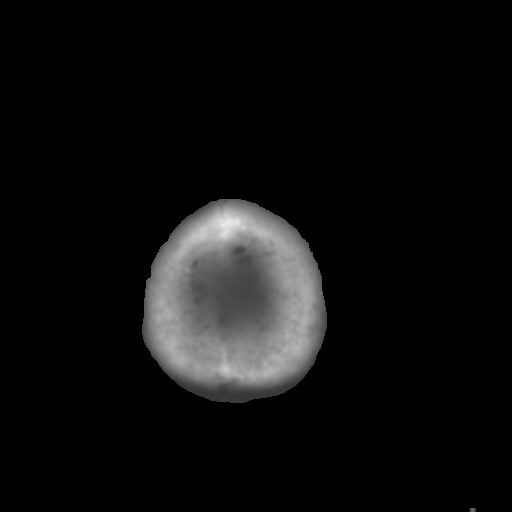

[Series 4: coronal soft tissue · coronal · 0.32mm/px · 3 of 60 slices shown]
[im 20/60  brain]
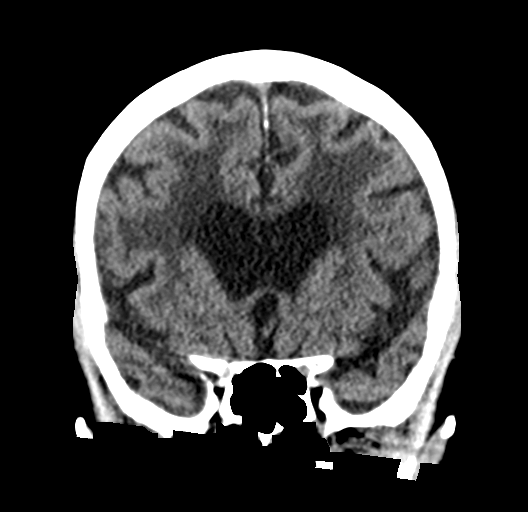
[im 27/60  brain]
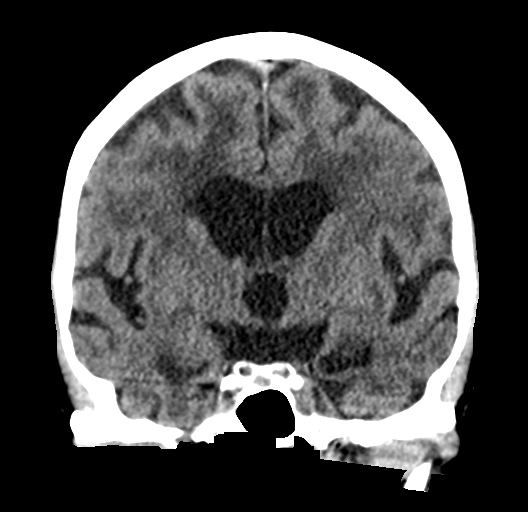
[im 33/60  brain]
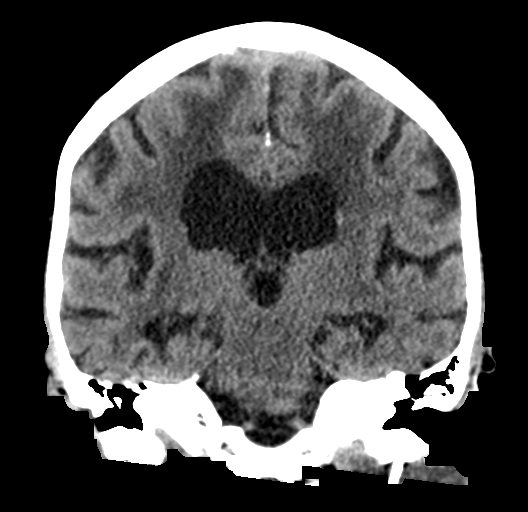

[Series 5: sagittal soft tissue · sagittal · 0.31mm/px · 3 of 48 slices shown]
[im 16/48  brain]
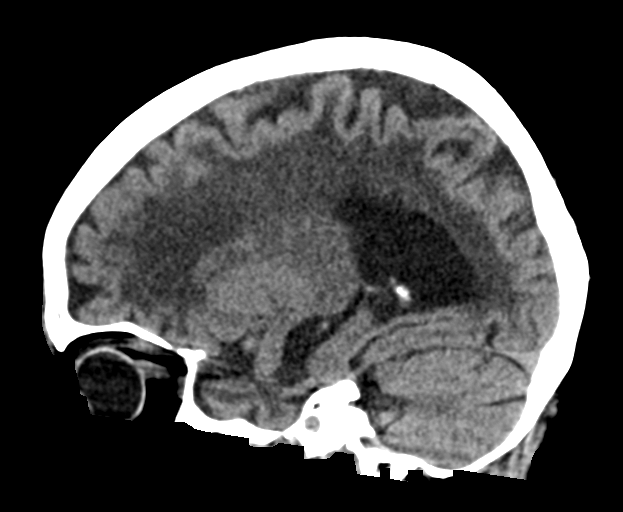
[im 24/48  brain]
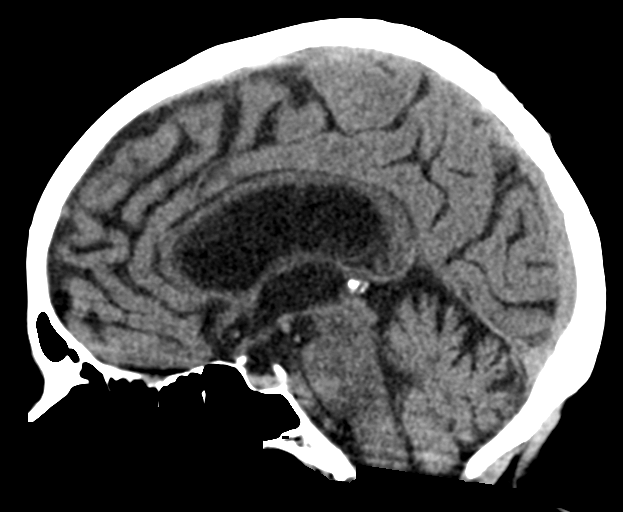
[im 32/48  brain]
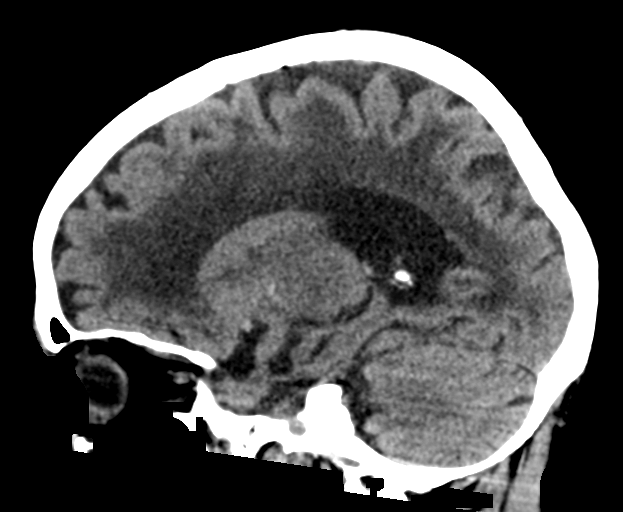

[15 of 47 positions shown; findings below may reference images not displayed]

FINDINGS: Brain: The ventricular system remains significantly dilated, with
prominent cortical sulci diffusely consistent with atrophy.
Moderately severe small vessel ischemic changes again noted
throughout the periventricular white matter. The septum is midline
in position. The fourth ventricle and basilar cisterns are
unremarkable. Benign-appearing bilateral basal ganglial
calcifications are unchanged. No hemorrhage, mass lesion, or acute
infarction is seen.

Vascular: No vascular abnormality is noted on this unenhanced study.

Skull: There is a right frontal scalp hematoma present. However no
underlying calvarial abnormality is seen.

Sinuses/Orbits: Air-fluid level is noted within the left maxillary
sinus consistent with sinusitis. No other paranasal sinus
abnormality is noted.

Other: None.
IMPRESSION: 1. No acute intracranial abnormality.
2. Stable moderately severe atrophy and small vessel ischemic change
throughout the periventricular white matter.
3. Left maxillary sinus disease.

## 2022-11-07 DEATH — deceased
